# Patient Record
Sex: Female | Born: 1975 | Hispanic: No | Marital: Married | Smoking: Never smoker
Health system: Southern US, Community
[De-identification: ages and names within clinical notes are randomized; demographics above are authoritative.]

## PROBLEM LIST (undated history)

## (undated) DIAGNOSIS — F909 Attention-deficit hyperactivity disorder, unspecified type: Secondary | ICD-10-CM

## (undated) DIAGNOSIS — F329 Major depressive disorder, single episode, unspecified: Secondary | ICD-10-CM

## (undated) DIAGNOSIS — K219 Gastro-esophageal reflux disease without esophagitis: Secondary | ICD-10-CM

## (undated) DIAGNOSIS — F32A Depression, unspecified: Secondary | ICD-10-CM

## (undated) DIAGNOSIS — E039 Hypothyroidism, unspecified: Secondary | ICD-10-CM

## (undated) DIAGNOSIS — F419 Anxiety disorder, unspecified: Secondary | ICD-10-CM

## (undated) HISTORY — DX: Gastro-esophageal reflux disease without esophagitis: K21.9

## (undated) HISTORY — DX: Anxiety disorder, unspecified: F41.9

## (undated) HISTORY — DX: Hypothyroidism, unspecified: E03.9

## (undated) HISTORY — PX: CHOLECYSTECTOMY: SHX55

## (undated) HISTORY — PX: COLONOSCOPY: SHX174

## (undated) HISTORY — DX: Major depressive disorder, single episode, unspecified: F32.9

## (undated) HISTORY — PX: TONSILLECTOMY: SUR1361

## (undated) HISTORY — DX: Depression, unspecified: F32.A

## (undated) HISTORY — PX: MASTECTOMY: SHX3

## (undated) HISTORY — DX: Attention-deficit hyperactivity disorder, unspecified type: F90.9

## (undated) HISTORY — PX: BREAST ENHANCEMENT SURGERY: SHX7

---

## 2009-06-16 ENCOUNTER — Encounter: Payer: Self-pay | Admitting: Internal Medicine

## 2009-06-20 ENCOUNTER — Encounter (INDEPENDENT_AMBULATORY_CARE_PROVIDER_SITE_OTHER): Payer: Self-pay | Admitting: *Deleted

## 2009-06-20 ENCOUNTER — Telehealth: Payer: Self-pay | Admitting: Internal Medicine

## 2009-07-04 ENCOUNTER — Encounter (INDEPENDENT_AMBULATORY_CARE_PROVIDER_SITE_OTHER): Payer: Self-pay | Admitting: *Deleted

## 2009-07-04 ENCOUNTER — Ambulatory Visit: Payer: Self-pay | Admitting: Internal Medicine

## 2009-07-04 DIAGNOSIS — K921 Melena: Secondary | ICD-10-CM | POA: Insufficient documentation

## 2009-07-04 DIAGNOSIS — IMO0001 Reserved for inherently not codable concepts without codable children: Secondary | ICD-10-CM | POA: Insufficient documentation

## 2009-07-04 DIAGNOSIS — R198 Other specified symptoms and signs involving the digestive system and abdomen: Secondary | ICD-10-CM | POA: Insufficient documentation

## 2009-07-04 DIAGNOSIS — R5383 Other fatigue: Secondary | ICD-10-CM

## 2009-07-04 DIAGNOSIS — D649 Anemia, unspecified: Secondary | ICD-10-CM

## 2009-07-04 DIAGNOSIS — R5381 Other malaise: Secondary | ICD-10-CM

## 2009-07-04 DIAGNOSIS — R634 Abnormal weight loss: Secondary | ICD-10-CM | POA: Insufficient documentation

## 2009-07-04 LAB — CONVERTED CEMR LAB
ALT: 18 units/L (ref 0–35)
Albumin: 4.4 g/dL (ref 3.5–5.2)
Alkaline Phosphatase: 45 units/L (ref 39–117)
Basophils Absolute: 0 10*3/uL (ref 0.0–0.1)
HCT: 35.3 % — ABNORMAL LOW (ref 36.0–46.0)
Lymphs Abs: 1.6 10*3/uL (ref 0.7–4.0)
Monocytes Absolute: 0.3 10*3/uL (ref 0.1–1.0)
Monocytes Relative: 5.4 % (ref 3.0–12.0)
Neutrophils Relative %: 61.6 % (ref 43.0–77.0)
Platelets: 237 10*3/uL (ref 150.0–400.0)
Potassium: 3.8 meq/L (ref 3.5–5.1)
RDW: 14.6 % (ref 11.5–14.6)
Sodium: 143 meq/L (ref 135–145)
Total Bilirubin: 0.5 mg/dL (ref 0.3–1.2)
Total Protein: 6.6 g/dL (ref 6.0–8.3)
WBC: 5.1 10*3/uL (ref 4.5–10.5)

## 2009-07-21 ENCOUNTER — Ambulatory Visit: Payer: Self-pay | Admitting: Internal Medicine

## 2009-07-21 HISTORY — PX: COLONOSCOPY: SHX174

## 2010-04-04 NOTE — Letter (Signed)
Summary: New Patient letter  [facility]  [address] [place], [place] [address]   Phone: [phone]  Fax: [phone]       06/20/2009 MRN: [id number]  Pamela Mcdowell [address] [place], [place]  [address]  Dear Pamela Mcdowell,  Welcome to the Gastroenterology Division at [facility].    You are scheduled to see Dr.  Leone Payor on Jul 25, 2009 at 2pm on the 3rd floor at [facility], [address]  We ask that you try to arrive at our office 15 minutes prior to your appointment time to allow for check-in.  We would like you to complete the enclosed self-administered evaluation form prior to your visit and bring it with you on the day of your appointment.  We will review it with you.  Also, please bring a complete list of all your medications or, if you prefer, bring the medication bottles and we will list them.  Please bring your insurance card so that we may make a copy of it.  If your insurance requires a referral to see a specialist, please bring your referral form from your primary care physician.  Co-payments are due at the time of your visit and may be paid by cash, check or credit card.     Your office visit will consist of a consult with your physician (includes a physical exam), any laboratory testing he/she may order, scheduling of any necessary diagnostic testing (e.g. x-ray, ultrasound, CT-scan), and scheduling of a procedure (e.g. Endoscopy, Colonoscopy) if required.  Please allow enough time on your schedule to allow for any/all of these possibilities.    If you cannot keep your appointment, please call [phone] to cancel or reschedule prior to your appointment date.  This allows [place] the opportunity to schedule an appointment for another patient in need of care.  If you do not cancel or reschedule by 5 p.m. the business day prior to your appointment date, you will be charged a $50.00 late cancellation/no-show fee.    Thank you for choosing  [facility] for your medical needs.  We appreciate the opportunity to care for you.  Please visit [place] at our website  to learn more about our practice.                     Sincerely,                                                             The Gastroenterology Division

## 2010-04-04 NOTE — Assessment & Plan Note (Signed)
Summary: LOWER BACK PAIN, LOOSE STOOLS,RECTAL BLEEDING/YF   History of Present Illness Visit Type: Initial Consult Primary GI MD: Stan Head MD [facility] Primary Provider: Alinda Deem, MD Requesting Provider: Alinda Deem, MD Chief Complaint: low back pain, loose stools, and rectal bleeding History of Present Illness:   35 yo woman without significant past medical history. She had acute abdominal pain in !04/2008 , US showd gallstones and she had a cholecystectomy and felt ok immediately after except for some transient diarrhea. She ran a half-marathon in February ok. Developed fatigue and muscle aches around daylihjy savings time. Then a few weeks later she saw blood in the toilet after defecation. It happened x 4 days and then stopped. Saw PCP and stool negative for occult blood. she has continues with fatigue, extremities tired and achy, and 5+ bowel movements a day. they are formed, sometimes slender or thin. Do not disturb sleep. She is anemic. Iron was started in Spring. These are new issues, no chronic problems. No diet changes or medication changes. Some weight loss since cholecystecomy, 10#. Periods without changes, uses pads 5 days, 3 days with significant blood.    GI Review of Systems    Reports abdominal pain, bloating, and  weight loss.   Weight loss of 10 lbs pounds   Denies acid reflux, belching, chest pain, dysphagia with liquids, dysphagia with solids, heartburn, loss of appetite, nausea, vomiting, vomiting blood, and  weight gain.      Reports change in bowel habits, diarrhea, and  rectal bleeding.     Denies anal fissure, black tarry stools, constipation, diverticulosis, fecal incontinence, heme positive stool, hemorrhoids, irritable bowel syndrome, jaundice, light color stool, liver problems, and  rectal pain. Preventive Screening-Counseling & Management  Alcohol-Tobacco     Smoking Status: never      Drug Use:  no.      Current Medications (verified): 1)   Concerta 36 Mg Cr-Tabs (Methylphenidate Hcl) .Marland Kitchen.. 1 By Mouth Once Daily 2)  Wellbutrin Xl 150 Mg Xr24h-Tab (Bupropion Hcl) .Marland Kitchen.. 1 By Mouth Two Times A Day 3)  Ferrous Sulfate 324 Mg Tbec (Ferrous Sulfate) .Marland Kitchen.. 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 Skin cancer-father PGF Lymphoma-PGF No FH of Colon Cancer:  Social History: Reviewed history and no changes required. 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  Alcohol Use - yes 5 per week Illicit Drug Use - no long-distance runner and frequent gym workoutsSmoking Status:  never Drug Use:  no  Review of Systems       The patient complains of anemia, back pain, depression-new, fatigue, headaches-new, and muscle pains/cramps.         All other ROS negative except as per HPI.   Vital Signs:  Patient profile:   35 year old female Height:      66134 inches Weight:      6 pounds BMI:     0.00 Pulse rate:   60 / minute Pulse rhythm:   regular BP sitting:   118 / 74  (left arm)  Vitals Entered By: Milford Cage NCMA (Jul 04, 2009 2:26 PM)  Physical Exam  General:  Thin, NAD Eyes:  PERRLA, no icterus. Mouth:  No deformity or lesions, dentition normal. Neck:  Supple; no masses or thyromegaly. Lungs:  Clear throughout to auscultation. Heart:  Regular rate and rhythm; no murmurs, rubs,  or bruits. Abdomen:  Soft, nontender and nondistended. No masses, hepatosplenomegaly or hernias noted. Normal bowel sounds. Rectal:  deferred until time of colonoscopy.   Extremities:  No clubbing, cyanosis, edema or deformities noted. Neurologic:  Alert and  oriented x4;   Cervical Nodes:  No significant cervical or supraclavicular adenopathy.  Psych:  Alert and cooperative. Normal mood and  affect.   Impression & Recommendations:  Problem # 1:  HEMATOCHEZIA (ICD-578.1) Assessment New  Small volume associated with increased stool frequency. Could be anorectal but has lost some weight. ? IBD or neoplasia. Celiac disease seems possible alsso given anemia and increased stools. Orders: T-Tissue Transglutamase Ab IgA [id number]) Colonoscopy (Colon) - Risks, benefits,and indications of endoscopic procedure(s) were reviewed with the patient and all questions answered.  TLB-CBC Platelet - w/Differential (85025-CBCD) TLB-IgA (Immunoglobulin A) (82784-IGA)  Problem # 2:  CHANGE IN BOWELS (UJW-119.14) Assessment: New Increased stool frequency. ? post-chole effect, celiac disease, IBD, neoplasia of bowel Await colonoscopy and labs if TTG Ab + then EGd and bx also  Problem # 3:  ANEMIA-UNSPECIFIED (ICD-285.9) Assessment: New does not seem to be related to menses (unless chronic blood loss from these) and with GI sxs and signs needs investigation.  Problem # 4:  FATIGUE (ICD-780.79) Assessment: New  Problem # 5:  MUSCLE PAIN (ICD-729.1) Assessment: New could be a component of celiac disease, ? other Orders: TLB-CMP (Comprehensive Metabolic Pnl) (80053-COMP) TLB-CK Total Only(Creatine Kinase/CPK) (82550-CK)  Problem # 6:  WEIGHT LOSS (ICD-783.21) Assessment: New etiology not clear, IBD, celiac, neoplasia are in differential dx  Patient Instructions: 1)  Please go to the basement to have your lab tests drawn today.  We will call you with further follow up instructions once these labs are available.  2)  Please pick up your medications at your pharmacy. 3)  We will see you at your colonoscopy on 07/21/09.  You will hear from [place] prior to this appointment if we need to add an upper endoscopy. 4)   Endoscopy Center Patient Information Guide given to patient.  5)  Colonoscopy and Flexible Sigmoidoscopy brochure given.  6)  Copy sent to : Alinda Deem, MD 7)  The  medication list was reviewed and reconciled.  All changed / newly prescribed medications were explained.  A complete medication list was provided to the patient / caregiver. Prescriptions: MOVIPREP 100 GM  SOLR (PEG-KCL-NACL-NASULF-NA ASC-C) As per prep instructions.  #1 x 0   Entered by:   Francee Piccolo CMA (AAMA)   Authorized by:   Iva Boop MD, [facility]   Signed by:   Francee Piccolo CMA (AAMA) on 07/04/2009   Method used:   Electronically to        [facility] [phone]* (retail)       [address]       [place], [place]  [address]       Ph: [phone] or [phone]       Fax: [phone]   RxID:   [id number]  Patient: [name] Note: All result statuses are Final unless otherwise noted.  Tests: (1) CBC Platelet w/Diff (CBCD)   White Cell Count          5.1 K/uL                    4.5-10.5   Red Cell Count  3.88 Mil/uL                 3.87-5.11   Hemoglobin                12.0 g/dL                   16.1-09.6   Hematocrit           [L]  35.3 %                      36.0-46.0   MCV                       90.9 fl                     78.0-100.0   MCHC                      34.1 g/dL                   04.5-40.9   RDW                       14.6 %                      11.5-14.6   Platelet Count            237.0 K/uL                  150.0-400.0   Neutrophil %              61.6 %                      43.0-77.0   Lymphocyte %              31.4 %                      12.0-46.0   Monocyte %                5.4 %                       3.0-12.0   Eosinophils%              0.9 %                       0.0-5.0   Basophils %               0.7 %                       0.0-3.0   Neutrophill Absolute      3.2 K/uL                    1.4-7.7   Lymphocyte Absolute       1.6 K/uL                    0.7-4.0   Monocyte Absolute         0.3 K/uL                    0.1-1.0  Eosinophils, Absolute  0.0 K/uL                     0.0-0.7   Basophils Absolute        0.0 K/uL                    0.0-0.1  Tests: (2) CMP (COMP)   Sodium                    143 mEq/L                   135-145   Potassium                 3.8 mEq/L                   3.5-5.1   Chloride                  105 mEq/L                   96-112   Carbon Dioxide       [H]  33 mEq/L                    19-32   Glucose                   90 mg/dL                    16-10   BUN                       12 mg/dL                    9-60   Creatinine                0.7 mg/dL                   4.5-4.0   Total Bilirubin           0.5 mg/dL                   9.8-1.1   Alkaline Phosphatase      45 U/L                      39-117   AST                       30 U/L                      0-37   ALT                       18 U/L                      0-35   Total Protein             6.6 g/dL                    9.1-4.7   Albumin                   4.4 g/dL                    8.2-9.5   Calcium  9.6 mg/dL                   1.6-10.9   GFR                       103.30 mL/min               >60  Tests: (3) Creatine Kinase (CK)   Creatine Kinase           163 U/L                     7-177  Tests: (4) Immunoglobulin A (IGA)   Immunoglobulin A          109 mg/dL                   60-454        Note: An exclamation mark (!) indicates a result that was not dispersed into the flowsheet. Document Creation Date: 07/04/2009 5:28 PM

## 2010-04-04 NOTE — Letter (Signed)
Summary: [facility]  [facility]   Imported By: Sherian Rein 07/13/2009 11:19:51  _____________________________________________________________________  External Attachment:    Type:   Image     Comment:   External Document

## 2010-04-04 NOTE — Progress Notes (Signed)
Summary: triage  Phone Note Call from Patient Call back at Home Phone [phone]   Caller: Patient Call For: Dr. Leone Payor Reason for Call: Talk to Nurse Summary of Call: would like sooner appt then currently sch'ed for...  May 23rd... offered May 13th with Dr. Jarold Motto, but pt wants sooner Initial call taken by: Vallarie Mare,  June 20, 2009 11:46 AM  Follow-up for Phone Call        Appt. moved up to 07/04/09 at 2:30 pm with Dr.Edwinna Rochette. Message left on pt's. machine. Follow-up by: Teryl Lucy RN,  June 21, 2009 9:24 AM

## 2010-04-04 NOTE — Letter (Signed)
Summary: [facility] Instructions  [facility]  [address] [place], [place] [address]   Phone: [phone]  Fax: [phone]       Pamela Mcdowell    Jun 05, 1975    MRN: [id number]      Procedure Day Dorna Bloom: [name], 07/21/09     Arrival Time: 12:30 PM      Procedure Time: 1:30 PM    Location of Procedure:                    _X_  [facility] (4th Floor)                      PREPARATION FOR COLONOSCOPY WITH MOVIPREP   Starting 5 days prior to your procedure 07/16/09 do not eat nuts, seeds, popcorn, corn, beans, peas,  salads, or any raw vegetables.  Do not take any fiber supplements (e.g. Metamucil, Citrucel, and Benefiber).  THE DAY BEFORE YOUR PROCEDURE         WEDNESDAY, 07/20/09  1.  Drink clear liquids the entire day-NO SOLID FOOD  2.  Do not drink anything colored red or purple.  Avoid juices with pulp.  No orange juice.  3.  Drink at least 64 oz. (8 glasses) of fluid/clear liquids during the day to prevent dehydration and help the prep work efficiently.  CLEAR LIQUIDS INCLUDE: Water Jello Ice Popsicles Tea (sugar ok, no milk/cream) Powdered fruit flavored drinks Coffee (sugar ok, no milk/cream) Gatorade Juice: apple, white grape, white cranberry  Lemonade Clear bullion, consomm, broth Carbonated beverages (any kind) Strained chicken noodle soup Hard Candy                             4.  In the morning, mix first dose of MoviPrep solution:    Empty 1 Pouch A and 1 Pouch B into the disposable container    Add lukewarm drinking water to the top line of the container. Mix to dissolve    Refrigerate (mixed solution should be used within 24 hrs)  5.  Begin drinking the prep at 5:00 p.m. The MoviPrep container is divided by 4 marks.   Every 15 minutes drink the solution down to the next mark (approximately 8 oz) until the full liter is complete.   6.  Follow completed prep with 16 oz of clear liquid of your choice (Nothing red or  purple).  Continue to drink clear liquids until bedtime.    7.  Before going to bed, mix second dose of MoviPrep solution:    Empty 1 Pouch A and 1 Pouch B into the disposable container    Add lukewarm drinking water to the top line of the container. Mix to dissolve    Refrigerate  THE DAY OF YOUR PROCEDURE      THURSDAY, 07/21/09  Beginning at 8:30a.m. (5 hours before procedure):         1. Every 15 minutes, drink the solution down to the next mark (approx 8 oz) until the full liter is complete.  2. Follow completed prep with 16 oz. of clear liquid of your choice.    3. You may drink clear liquids until 11:30 AM (2 HOURS BEFORE PROCEDURE).  MEDICATION INSTRUCTIONS  Unless otherwise instructed, you should take regular prescription medications with a small sip of water   as early as possible the morning of your procedure.       OTHER INSTRUCTIONS  You will need a responsible adult at least 33 years of age to accompany you and drive you home.   This person must remain in the waiting room during your procedure.  Wear loose fitting clothing that is easily removed.  Leave jewelry and other valuables at home.  However, you may wish to bring a book to read or  an iPod/MP3 player to listen to music as you wait for your procedure to start.  Remove all body piercing jewelry and leave at home.  Total time from sign-in until discharge is approximately 2-3 hours.  You should go home directly after your procedure and rest.  You can resume normal activities the  day after your procedure.  The day of your procedure you should not:   Drive   Make legal decisions   Operate machinery   Drink alcohol   Return to work  You will receive specific instructions about eating, activities and medications before you leave.  The above instructions have been reviewed and explained to me by   [facility], CMA  I fully understand and can verbalize these instructions  _____________________________ Date _________

## 2010-04-04 NOTE — Procedures (Signed)
Summary: Colonoscopy  Patient: [name] Note: All result statuses are Final unless otherwise noted.  Tests: (1) Colonoscopy (COL)   COL Colonoscopy           DONE (C)      [facility]oscopy Center     [address]     [place], [place]  [address]           COLONOSCOPY PROCEDURE REPORT           PATIENT:  Pamela Mcdowell, Pamela Mcdowell  MR#:  [id number]     BIRTHDATE:  07/08/1975, 33 yrs. old  GENDER:  female     ENDOSCOPIST:  Iva Boop, MD, [facility]     REF. BY:  Alinda Deem, M.D.     PROCEDURE DATE:  07/21/2009     PROCEDURE:  Colonoscopy 19[address]     ASA CLASS:  Class I     INDICATIONS:  hematochezia     MEDICATIONS:   Fentanyl 75 mcg IV, Versed 6 mg IVCORRECTION: 5 mg     Versed           DESCRIPTION OF PROCEDURE:   After the risks benefits and     alternatives of the procedure were thoroughly explained, informed     consent was obtained.  Digital rectal exam was performed and     revealed no abnormalities.   The  endoscope was introduced through     the anus and advanced to the terminal ileum which was intubated     for a short distance, without limitations.  The quality of the     prep was excellent, using MoviPrep.  The instrument was then     slowly withdrawn as the colon was fully examined. Insertion: 4:20     minutes withdrawal: 5:15 minutes     <<PROCEDUREIMAGES>>           FINDINGS:  The terminal ileum appeared normal.  A normal appearing     cecum, ileocecal valve, and appendiceal orifice were identified.     The ascending, hepatic flexure, transverse, splenic flexure,     descending, sigmoid colon, and rectum appeared unremarkable.  This     was otherwise a normal examination of the colon.   Retroflexed     views in the rectum revealed internal hemorrhoids.  They were     small.  The scope was then withdrawn from the patient and the     procedure completed.           COMPLICATIONS:  None     ENDOSCOPIC IMPRESSION:     1) Normal colon     2) Normal terminal ileum   3) Internal hemorrhoids     4) Otherwise normal examination, excellent prep           RECOMMENDATIONS:     Start hyoscyamine 0.375 mg twice a day. Prescription sent to     pharmacy.           ADDENDUM: She was instructed to call for a follow-up appointment     to be seen in 6- 8 weeks.           Iva Boop, MD, [facility]           CC:  Alinda Deem, MD     The Patient           n.     REVISED:  08/03/2009 09:55 AM     eSIGNED:   Iva Boop at 08/03/2009 09:55 AM  [place], [place], [phone]  Note: An exclamation mark (!) indicates a result that was not dispersed into the flowsheet. Document Creation Date: 08/03/2009 9:56 AM _______________________________________________________________________  (1) Order result status: Final Collection or observation date-time: 07/21/2009 13:53 Requested date-time:  Receipt date-time:  Reported date-time:  Referring Physician:   Ordering Physician: Stan Head [phone]) Specimen Source:  Source: Launa Grill Order Number: [id number] Lab site:

## 2010-04-04 NOTE — Miscellaneous (Signed)
Summary: hyoscyamine rx after colonoscopy  Clinical Lists Changes  Medications: Removed medication of MOVIPREP 100 GM  SOLR (PEG-KCL-NACL-NASULF-NA ASC-C) As per prep instructions. Added new medication of HYOSCYAMINE SULFATE CR 0.375 MG XR12H-TAB (HYOSCYAMINE SULFATE) 1 by mouth bid - Signed Rx of HYOSCYAMINE SULFATE CR 0.375 MG XR12H-TAB (HYOSCYAMINE SULFATE) 1 by mouth bid;  #60 x 1;  Signed;  Entered by: Iva Boop MD, [facility];  Authorized by: Iva Boop MD, [facility];  Method used: Electronically to [facility] [phone]*, [address] [place], [place], [place]  [address], Ph: [phone] or [phone], Fax: [phone]    Prescriptions: HYOSCYAMINE SULFATE CR 0.375 MG XR12H-TAB (HYOSCYAMINE SULFATE) 1 by mouth bid  #60 x 1   Entered and Authorized by:   Iva Boop MD, [facility]   Signed by:   Iva Boop MD, FACG on 07/21/2009   Method used:   Electronically to        [facility]. [phone]* (retail)       [address]       [place], [place]  [address]       Ph: [phone] or [phone]       Fax: [phone]   RxID:   [id number]

## 2014-07-07 ENCOUNTER — Encounter: Payer: Self-pay | Admitting: Internal Medicine

## 2018-02-14 ENCOUNTER — Encounter: Payer: Self-pay | Admitting: Physician Assistant

## 2018-02-21 ENCOUNTER — Other Ambulatory Visit: Payer: Self-pay | Admitting: *Deleted

## 2018-02-21 ENCOUNTER — Encounter: Payer: Self-pay | Admitting: Physician Assistant

## 2018-02-21 ENCOUNTER — Ambulatory Visit: Payer: 59 | Admitting: Physician Assistant

## 2018-02-21 VITALS — BP 140/92 | HR 93 | Ht 66.0 in | Wt 146.0 lb

## 2018-02-21 DIAGNOSIS — R110 Nausea: Secondary | ICD-10-CM

## 2018-02-21 DIAGNOSIS — R1319 Other dysphagia: Secondary | ICD-10-CM

## 2018-02-21 DIAGNOSIS — R1310 Dysphagia, unspecified: Secondary | ICD-10-CM | POA: Diagnosis not present

## 2018-02-21 DIAGNOSIS — K219 Gastro-esophageal reflux disease without esophagitis: Secondary | ICD-10-CM

## 2018-02-21 MED ORDER — ESOMEPRAZOLE MAGNESIUM 40 MG PO CPDR: DELAYED_RELEASE_CAPSULE | ORAL | 3 refills | Status: DC

## 2018-02-21 MED ORDER — AMBULATORY NON FORMULARY MEDICATION: 0 refills | Status: DC

## 2018-02-21 NOTE — Progress Notes (Signed)
Chief Complaint: GERD, dysphagia, nausea  HPI:    [name] is a 42 year old female with a past medical history as listed below, who presents to clinic today for complaint of nausea, GERD and dysphagia.    Today, the patient describes that she has had reflux symptoms for at least a year for which she was using over-the-counter Zantac or over-the-counter Prilosec which would only help slightly.  Describes severe instances where she was having to "stretch out her stomach" or lie on a cold floor in order to make it feel better.  Recently this has become more frequent and an almost daily occurrence.  Also describes having to wake up in the middle of the night and "clear my throat".  Along with this has been experiencing some feeling of foods getting stuck when she is eating which has also increased.  Some nausea, especially worse with exercise.    Patient also notes various other health complaints including hair thinning and falling out, occasional heart racing and foot and calf cramps over the past few months.  She is having her PCP look into this, just had thyroid panel drawn.  Was also recently told she had vitamin D deficiency and is on supplementation.    Denies fever, chills, weight loss, anorexia, vomiting, change in bowel habits or blood in her stool.  Past Medical History:  Diagnosis Date  . GERD (gastroesophageal reflux disease)     Past Surgical History:  Procedure Laterality Date  . CHOLECYSTECTOMY    . COLONOSCOPY    . TONSILLECTOMY      Current Outpatient Medications  Medication Sig Dispense Refill  . ergocalciferol (VITAMIN D2) 1.25 MG (50000 UT) capsule Take 50,000 Units by mouth once a week.    . pantoprazole (PROTONIX) 40 MG tablet Take 40 mg by mouth daily.     No current facility-administered medications for this visit.     Allergies as of 02/21/2018  . (Not on File)    History reviewed. No pertinent family history.  Social History   Socioeconomic History    . Marital status: Married    Spouse name: Not on file  . Number of children: 1  . Years of education: Not on file  . Highest education level: Not on file  Occupational History  . Occupation: Forensic scientistController   Social Needs  . Financial resource strain: Not on file  . Food insecurity:    Worry: Not on file    Inability: Not on file  . Transportation needs:    Medical: Not on file    Non-medical: Not on file  Tobacco Use  . Smoking status: Never Smoker  . Smokeless tobacco: Never Used  Substance and Sexual Activity  . Alcohol use: Yes    Alcohol/week: 7.0 standard drinks    Types: 7 Glasses of wine per week  . Drug use: Never  . Sexual activity: Not on file  Lifestyle  . Physical activity:    Days per week: Not on file    Minutes per session: Not on file  . Stress: Not on file  Relationships  . Social connections:    Talks on phone: Not on file    Gets together: Not on file    Attends religious service: Not on file    Active member of club or organization: Not on file    Attends meetings of clubs or organizations: Not on file    Relationship status: Not on file  . Intimate partner violence:  Fear of current or ex partner: Not on file    Emotionally abused: Not on file    Physically abused: Not on file    Forced sexual activity: Not on file  Other Topics Concern  . Not on file  Social History Narrative  . Not on file    Review of Systems:    Constitutional: No weight loss, fever or chills Skin: No rash  Cardiovascular: No chest pain Respiratory: No SOB  Gastrointestinal: See HPI and otherwise negative Genitourinary: No dysuria  Neurological: No headache, dizziness or syncope Musculoskeletal: No new muscle or joint pain Hematologic: No bleeding  Psychiatric: No history of depression or anxiety   Physical Exam:  Vital signs: BP (!) 140/92   Pulse 93   Ht 5\' 6"  (1.676 m)   Wt 146 lb (66.2 kg)   SpO2 100%   BMI 23.57 kg/m   Constitutional:   Pleasant  Caucasian female appears to be in NAD, Well developed, Well nourished, alert and cooperative Head:  Normocephalic and atraumatic. Eyes:   PEERL, EOMI. No icterus. Conjunctiva pink. Ears:  Normal auditory acuity. Neck:  Supple Throat: Oral cavity and pharynx without inflammation, swelling or lesion.  Respiratory: Respirations even and unlabored. Lungs clear to auscultation bilaterally.   No wheezes, crackles, or rhonchi.  Cardiovascular: Normal S1, S2. No MRG. Regular rate and rhythm. No peripheral edema, cyanosis or pallor.  Gastrointestinal:  Soft, nondistended, nontender. No rebound or guarding. Normal bowel sounds. No appreciable masses or hepatomegaly. Rectal:  Not performed.  Msk:  Symmetrical without gross deformities. Without edema, no deformity or joint abnormality.  Neurologic:  Alert and  oriented x4;  grossly normal neurologically.  Skin:   Dry and intact without significant lesions or rashes. Psychiatric: Demonstrates good judgement and reason without abnormal affect or behaviors.  Requesting recent labs.  Assessment: 1.  GERD: Over the past year, worse over the past few months, no help from pantoprazole 40 mg; consider H. pylori versus PUD versus GERD 2.  Dysphagia: Occasional food gets stuck in the patient's throat, worse over the past few months; consider esophageal stricture versus ring versus other 3.  Nausea  Plan: 1.  Scheduled patient for an EGD in the [facility] with Dr. Lavon PaganiniNandigam as she had availability.  Did discuss risks, benefits, limitations and alternatives and the patient agrees to proceed.  Patient will continue to follow with Dr. Lavon PaganiniNandigam as her primary GI physician after time of procedure. 2.  Stopped Pantoprazole.  Started Esomeprazole 40 mg daily, 30-60 minutes before eating breakfast #30 with 3 refills. 3.  Provided the patient with GI cocktail 5-10 mL's every 6 hours as needed for severe pain 4.  Patient does question whether or not some of her health concerns  over the past week or so related to addition of Pantoprazole. Discussed this. 5.  Patient to follow in clinic per recommendations from Dr. Lavon PaganiniNandigam after time of procedure.  Did discuss briefly with the patient that if her thyroid testing is normal would recommend she see someone in regards to autoimmune disorders given her various health complaints today.  Hyacinth MeekerJennifer Ferry Matthis, PA-C [facility] 02/21/2018, 10:26 AM

## 2018-02-21 NOTE — Patient Instructions (Addendum)
We have provided you with a Reflux handout. We will call in a prescription for GI Cocktail. It will be at a compounding pharmacy. They will call you when it is ready.  Stop the Pantoprazole sodium 40 mg. .  We will call in the Esomeprazole 40 mg daily. Take 1 cap 30-60 min before breakfast.   You have been scheduled for an endoscopy. Please follow written instructions given to you at your visit today. If you use inhalers (even only as needed), please bring them with you on the day of your procedure. Normal BMI (Body Mass Index- based on height and weight) is between 19 and 25. Your BMI today is Body mass index is 23.57 kg/m. Marland Kitchen. Please consider follow up  regarding your BMI with your Primary Care Provider.

## 2018-02-21 NOTE — Progress Notes (Signed)
Non

## 2018-03-10 ENCOUNTER — Ambulatory Visit (AMBULATORY_SURGERY_CENTER): Payer: 59 | Admitting: Gastroenterology

## 2018-03-10 ENCOUNTER — Encounter: Payer: Self-pay | Admitting: Gastroenterology

## 2018-03-10 ENCOUNTER — Other Ambulatory Visit: Payer: Self-pay

## 2018-03-10 VITALS — BP 114/60 | HR 65 | Temp 98.6°F | Resp 25 | Ht 66.0 in | Wt 146.0 lb

## 2018-03-10 DIAGNOSIS — K2970 Gastritis, unspecified, without bleeding: Secondary | ICD-10-CM | POA: Diagnosis not present

## 2018-03-10 DIAGNOSIS — K317 Polyp of stomach and duodenum: Secondary | ICD-10-CM | POA: Diagnosis not present

## 2018-03-10 DIAGNOSIS — K219 Gastro-esophageal reflux disease without esophagitis: Secondary | ICD-10-CM

## 2018-03-10 MED ORDER — SODIUM CHLORIDE 0.9 % IV SOLN: 500.0000 mL | Freq: Once | INTRAVENOUS | Status: DC

## 2018-03-10 NOTE — Progress Notes (Signed)
Report to PACU, RN, vss, BBS= Clear.  

## 2018-03-10 NOTE — Progress Notes (Signed)
Called to room to assist during endoscopic procedure.  Patient ID and intended procedure confirmed with present staff. Received instructions for my participation in the procedure from the performing physician.  

## 2018-03-10 NOTE — Op Note (Signed)
[facility] Patient Name: Pamela Mcdowell Procedure Date: 03/10/2018 9:59 AM MRN: [id number] Endoscopist: Napoleon FormKavitha V. Alajah Witman , MD Age: 43 Referring MD:  Date of Birth: 10/02/1975 Gender: Female Account #: [id number] Procedure:                Upper GI endoscopy Indications:              Dysphagia, Esophageal reflux symptoms that persist                            despite appropriate therapy Medicines:                Monitored Anesthesia Care Procedure:                Pre-Anesthesia Assessment:                           - Prior to the procedure, a History and Physical                            was performed, and patient medications and                            allergies were reviewed. The patient's tolerance of                            previous anesthesia was also reviewed. The risks                            and benefits of the procedure and the sedation                            options and risks were discussed with the patient.                            All questions were answered, and informed consent                            was obtained. Prior Anticoagulants: The patient has                            taken no previous anticoagulant or antiplatelet                            agents. ASA Grade Assessment: II - A patient with                            mild systemic disease. After reviewing the risks                            and benefits, the patient was deemed in                            satisfactory condition to undergo the procedure.  After obtaining informed consent, the endoscope was                            passed under direct vision. Throughout the                            procedure, the patient's blood pressure, pulse, and                            oxygen saturations were monitored continuously. The                            Endoscope was introduced through the mouth, and                            advanced to the second  part of duodenum. The upper                            GI endoscopy was accomplished without difficulty.                            The patient tolerated the procedure well. Scope In: Scope Out: Findings:                 The Z-line was variable and was found 35 cm from                            the incisors.                           No endoscopic abnormality was evident in the                            esophagus to explain the patient's complaint of                            dysphagia. Biopsies were obtained from the proximal                            and distal esophagus with cold forceps for                            histology of suspected eosinophilic esophagitis.                           Patchy mild inflammation characterized by                            congestion (edema) and erythema was found in the                            entire examined stomach. Biopsies were taken with a                            cold  forceps for Helicobacter pylori testing.                           The examined duodenum was normal. Complications:            No immediate complications. Estimated Blood Loss:     Estimated blood loss was minimal. Impression:               - Z-line variable, 35 cm from the incisors.                           - No endoscopic esophageal abnormality to explain                            patient's dysphagia. Biopsied.                           - Gastritis. Biopsied.                           - Normal examined duodenum. Recommendation:           - Patient has a contact number available for                            emergencies. The signs and symptoms of potential                            delayed complications were discussed with the                            patient. Return to normal activities tomorrow.                            Written discharge instructions were provided to the                            patient.                           - Resume previous diet.                            - Continue present medications.                           - Await pathology results.                           - Return to GI office at the next available                            appointment. Napoleon Form, MD 03/10/2018 10:17:07 AM This report has been signed electronically.

## 2018-03-10 NOTE — Patient Instructions (Signed)
YOU HAD AN ENDOSCOPIC PROCEDURE TODAY AT THE [facility]:   Refer to the procedure report that was given to you for any specific questions about what was found during the examination.  If the procedure report does not answer your questions, please call your gastroenterologist to clarify.  If you requested that your care partner not be given the details of your procedure findings, then the procedure report has been included in a sealed envelope for you to review at your convenience later.  YOU SHOULD EXPECT: Some feelings of bloating in the abdomen. Passage of more gas than usual.  Walking can help get rid of the air that was put into your GI tract during the procedure and reduce the bloating. If you had a lower endoscopy (such as a colonoscopy or flexible sigmoidoscopy) you may notice spotting of blood in your stool or on the toilet paper. If you underwent a bowel prep for your procedure, you may not have a normal bowel movement for a few days.  Please Note:  You might notice some irritation and congestion in your nose or some drainage.  This is from the oxygen used during your procedure.  There is no need for concern and it should clear up in a day or so.  SYMPTOMS TO REPORT IMMEDIATELY:   Following upper endoscopy (EGD)  Vomiting of blood or coffee ground material  New chest pain or pain under the shoulder blades  Painful or persistently difficult swallowing  New shortness of breath  Fever of 100F or higher  Black, tarry-looking stools  For urgent or emergent issues, a gastroenterologist can be reached at any hour by calling [phone].   DIET:  We do recommend a small meal at first, but then you may proceed to your regular diet.  Drink plenty of fluids but you should avoid alcoholic beverages for 24 hours.  ACTIVITY:  You should plan to take it easy for the rest of today and you should NOT DRIVE or use heavy machinery until tomorrow (because of the sedation medicines used  during the test).    FOLLOW UP: Our staff will call the number listed on your records the next business day following your procedure to check on you and address any questions or concerns that you may have regarding the information given to you following your procedure. If we do not reach you, we will leave a message.  However, if you are feeling well and you are not experiencing any problems, there is no need to return our call.  We will assume that you have returned to your regular daily activities without incident.  If any biopsies were taken you will be contacted by phone or by letter within the next 1-3 weeks.  Please call us at [phone] if you have not heard about the biopsies in 3 weeks.    SIGNATURES/CONFIDENTIALITY: You and/or your care partner have signed paperwork which will be entered into your electronic medical record.  These signatures attest to the fact that that the information above on your After Visit Summary has been reviewed and is understood.  Full responsibility of the confidentiality of this discharge information lies with you and/or your care-partner. 

## 2018-03-11 ENCOUNTER — Telehealth: Payer: Self-pay | Admitting: *Deleted

## 2018-03-11 NOTE — Telephone Encounter (Signed)
  Follow up Call-  Call back number 03/10/2018  Post procedure Call Back phone  # [phone]  Permission to leave phone message Yes  Some recent data might be hidden     Patient questions:  Do you have a fever, pain , or abdominal swelling? No. Pain Score  0 *  Have you tolerated food without any problems? Yes.    Have you been able to return to your normal activities? Yes.    Do you have any questions about your discharge instructions: Diet   No. Medications  No. Follow up visit  No.  Do you have questions or concerns about your Care? Yes.    Actions: * If pain score is 4 or above: No action needed, pain <4.

## 2018-03-13 NOTE — Progress Notes (Signed)
Reviewed and agree with documentation and assessment and plan. K. Veena Shylo Dillenbeck , MD   

## 2018-03-19 ENCOUNTER — Encounter: Payer: Self-pay | Admitting: Gastroenterology

## 2018-12-04 DIAGNOSIS — C50919 Malignant neoplasm of unspecified site of unspecified female breast: Secondary | ICD-10-CM

## 2018-12-04 HISTORY — DX: Malignant neoplasm of unspecified site of unspecified female breast: C50.919

## 2019-03-05 DIAGNOSIS — G8929 Other chronic pain: Secondary | ICD-10-CM

## 2019-03-05 HISTORY — DX: Other chronic pain: G89.29

## 2019-04-28 ENCOUNTER — Other Ambulatory Visit: Payer: Self-pay | Admitting: Otolaryngology

## 2019-04-28 ENCOUNTER — Ambulatory Visit
Admission: RE | Admit: 2019-04-28 | Discharge: 2019-04-28 | Disposition: A | Discharge status: Home / Self Care | Payer: 59 | Source: Ambulatory Visit | Attending: Otolaryngology | Admitting: Otolaryngology

## 2019-04-28 DIAGNOSIS — R221 Localized swelling, mass and lump, neck: Secondary | ICD-10-CM

## 2019-05-08 ENCOUNTER — Other Ambulatory Visit: Payer: Self-pay | Admitting: Otolaryngology

## 2019-05-08 DIAGNOSIS — R221 Localized swelling, mass and lump, neck: Secondary | ICD-10-CM

## 2019-09-28 DIAGNOSIS — F419 Anxiety disorder, unspecified: Secondary | ICD-10-CM | POA: Diagnosis not present

## 2019-09-28 DIAGNOSIS — F329 Major depressive disorder, single episode, unspecified: Secondary | ICD-10-CM | POA: Diagnosis not present

## 2019-10-08 DIAGNOSIS — F419 Anxiety disorder, unspecified: Secondary | ICD-10-CM | POA: Diagnosis not present

## 2019-10-08 DIAGNOSIS — F329 Major depressive disorder, single episode, unspecified: Secondary | ICD-10-CM | POA: Diagnosis not present

## 2019-10-15 DIAGNOSIS — Z1389 Encounter for screening for other disorder: Secondary | ICD-10-CM | POA: Diagnosis not present

## 2019-10-15 DIAGNOSIS — E538 Deficiency of other specified B group vitamins: Secondary | ICD-10-CM | POA: Diagnosis not present

## 2019-10-15 DIAGNOSIS — F064 Anxiety disorder due to known physiological condition: Secondary | ICD-10-CM | POA: Diagnosis not present

## 2019-10-15 DIAGNOSIS — F900 Attention-deficit hyperactivity disorder, predominantly inattentive type: Secondary | ICD-10-CM | POA: Diagnosis not present

## 2019-10-15 DIAGNOSIS — Z6824 Body mass index (BMI) 24.0-24.9, adult: Secondary | ICD-10-CM | POA: Diagnosis not present

## 2019-10-15 DIAGNOSIS — Z0000 Encounter for general adult medical examination without abnormal findings: Secondary | ICD-10-CM | POA: Diagnosis not present

## 2019-10-15 DIAGNOSIS — F331 Major depressive disorder, recurrent, moderate: Secondary | ICD-10-CM | POA: Diagnosis not present

## 2019-10-21 DIAGNOSIS — Z853 Personal history of malignant neoplasm of breast: Secondary | ICD-10-CM | POA: Diagnosis not present

## 2019-10-21 DIAGNOSIS — Z9013 Acquired absence of bilateral breasts and nipples: Secondary | ICD-10-CM | POA: Diagnosis not present

## 2019-10-22 DIAGNOSIS — F329 Major depressive disorder, single episode, unspecified: Secondary | ICD-10-CM | POA: Diagnosis not present

## 2019-10-22 DIAGNOSIS — F419 Anxiety disorder, unspecified: Secondary | ICD-10-CM | POA: Diagnosis not present

## 2019-10-30 DIAGNOSIS — U071 COVID-19: Secondary | ICD-10-CM | POA: Diagnosis not present

## 2019-12-01 DIAGNOSIS — F419 Anxiety disorder, unspecified: Secondary | ICD-10-CM | POA: Diagnosis not present

## 2019-12-01 DIAGNOSIS — F329 Major depressive disorder, single episode, unspecified: Secondary | ICD-10-CM | POA: Diagnosis not present

## 2019-12-02 DIAGNOSIS — Z01419 Encounter for gynecological examination (general) (routine) without abnormal findings: Secondary | ICD-10-CM | POA: Diagnosis not present

## 2019-12-02 DIAGNOSIS — Z1151 Encounter for screening for human papillomavirus (HPV): Secondary | ICD-10-CM | POA: Diagnosis not present

## 2019-12-29 DIAGNOSIS — F430 Acute stress reaction: Secondary | ICD-10-CM | POA: Diagnosis not present

## 2019-12-29 DIAGNOSIS — F900 Attention-deficit hyperactivity disorder, predominantly inattentive type: Secondary | ICD-10-CM | POA: Diagnosis not present

## 2019-12-29 DIAGNOSIS — Z6825 Body mass index (BMI) 25.0-25.9, adult: Secondary | ICD-10-CM | POA: Diagnosis not present

## 2019-12-30 DIAGNOSIS — Z3202 Encounter for pregnancy test, result negative: Secondary | ICD-10-CM | POA: Diagnosis not present

## 2019-12-30 DIAGNOSIS — F329 Major depressive disorder, single episode, unspecified: Secondary | ICD-10-CM | POA: Diagnosis not present

## 2019-12-30 DIAGNOSIS — F419 Anxiety disorder, unspecified: Secondary | ICD-10-CM | POA: Diagnosis not present

## 2019-12-30 DIAGNOSIS — Z30430 Encounter for insertion of intrauterine contraceptive device: Secondary | ICD-10-CM | POA: Diagnosis not present

## 2020-01-25 DIAGNOSIS — Z30431 Encounter for routine checking of intrauterine contraceptive device: Secondary | ICD-10-CM | POA: Diagnosis not present

## 2020-01-25 DIAGNOSIS — R5383 Other fatigue: Secondary | ICD-10-CM | POA: Diagnosis not present

## 2020-02-05 DIAGNOSIS — M9903 Segmental and somatic dysfunction of lumbar region: Secondary | ICD-10-CM | POA: Diagnosis not present

## 2020-02-05 DIAGNOSIS — M9902 Segmental and somatic dysfunction of thoracic region: Secondary | ICD-10-CM | POA: Diagnosis not present

## 2020-02-05 DIAGNOSIS — M9905 Segmental and somatic dysfunction of pelvic region: Secondary | ICD-10-CM | POA: Diagnosis not present

## 2020-02-05 DIAGNOSIS — M5451 Vertebrogenic low back pain: Secondary | ICD-10-CM | POA: Diagnosis not present

## 2020-02-08 DIAGNOSIS — M9905 Segmental and somatic dysfunction of pelvic region: Secondary | ICD-10-CM | POA: Diagnosis not present

## 2020-02-08 DIAGNOSIS — M9902 Segmental and somatic dysfunction of thoracic region: Secondary | ICD-10-CM | POA: Diagnosis not present

## 2020-02-08 DIAGNOSIS — M5451 Vertebrogenic low back pain: Secondary | ICD-10-CM | POA: Diagnosis not present

## 2020-02-08 DIAGNOSIS — M9903 Segmental and somatic dysfunction of lumbar region: Secondary | ICD-10-CM | POA: Diagnosis not present

## 2020-02-11 DIAGNOSIS — M5451 Vertebrogenic low back pain: Secondary | ICD-10-CM | POA: Diagnosis not present

## 2020-02-11 DIAGNOSIS — M9905 Segmental and somatic dysfunction of pelvic region: Secondary | ICD-10-CM | POA: Diagnosis not present

## 2020-02-11 DIAGNOSIS — M9903 Segmental and somatic dysfunction of lumbar region: Secondary | ICD-10-CM | POA: Diagnosis not present

## 2020-02-11 DIAGNOSIS — M9902 Segmental and somatic dysfunction of thoracic region: Secondary | ICD-10-CM | POA: Diagnosis not present

## 2020-02-15 DIAGNOSIS — E538 Deficiency of other specified B group vitamins: Secondary | ICD-10-CM | POA: Diagnosis not present

## 2020-02-15 DIAGNOSIS — F900 Attention-deficit hyperactivity disorder, predominantly inattentive type: Secondary | ICD-10-CM | POA: Diagnosis not present

## 2020-02-15 DIAGNOSIS — F321 Major depressive disorder, single episode, moderate: Secondary | ICD-10-CM | POA: Diagnosis not present

## 2020-02-15 DIAGNOSIS — Z6826 Body mass index (BMI) 26.0-26.9, adult: Secondary | ICD-10-CM | POA: Diagnosis not present

## 2020-02-16 DIAGNOSIS — M5451 Vertebrogenic low back pain: Secondary | ICD-10-CM | POA: Diagnosis not present

## 2020-02-16 DIAGNOSIS — M9902 Segmental and somatic dysfunction of thoracic region: Secondary | ICD-10-CM | POA: Diagnosis not present

## 2020-02-16 DIAGNOSIS — M9903 Segmental and somatic dysfunction of lumbar region: Secondary | ICD-10-CM | POA: Diagnosis not present

## 2020-02-16 DIAGNOSIS — M9905 Segmental and somatic dysfunction of pelvic region: Secondary | ICD-10-CM | POA: Diagnosis not present

## 2020-02-23 DIAGNOSIS — M5451 Vertebrogenic low back pain: Secondary | ICD-10-CM | POA: Diagnosis not present

## 2020-02-23 DIAGNOSIS — M9902 Segmental and somatic dysfunction of thoracic region: Secondary | ICD-10-CM | POA: Diagnosis not present

## 2020-02-23 DIAGNOSIS — M9905 Segmental and somatic dysfunction of pelvic region: Secondary | ICD-10-CM | POA: Diagnosis not present

## 2020-02-23 DIAGNOSIS — M9903 Segmental and somatic dysfunction of lumbar region: Secondary | ICD-10-CM | POA: Diagnosis not present

## 2020-03-02 DIAGNOSIS — M5451 Vertebrogenic low back pain: Secondary | ICD-10-CM | POA: Diagnosis not present

## 2020-03-02 DIAGNOSIS — M9905 Segmental and somatic dysfunction of pelvic region: Secondary | ICD-10-CM | POA: Diagnosis not present

## 2020-03-02 DIAGNOSIS — M9902 Segmental and somatic dysfunction of thoracic region: Secondary | ICD-10-CM | POA: Diagnosis not present

## 2020-03-02 DIAGNOSIS — M9903 Segmental and somatic dysfunction of lumbar region: Secondary | ICD-10-CM | POA: Diagnosis not present

## 2020-03-04 DIAGNOSIS — M5451 Vertebrogenic low back pain: Secondary | ICD-10-CM | POA: Diagnosis not present

## 2020-03-04 DIAGNOSIS — M9905 Segmental and somatic dysfunction of pelvic region: Secondary | ICD-10-CM | POA: Diagnosis not present

## 2020-03-04 DIAGNOSIS — M9903 Segmental and somatic dysfunction of lumbar region: Secondary | ICD-10-CM | POA: Diagnosis not present

## 2020-03-04 DIAGNOSIS — M9902 Segmental and somatic dysfunction of thoracic region: Secondary | ICD-10-CM | POA: Diagnosis not present

## 2020-03-09 DIAGNOSIS — M9905 Segmental and somatic dysfunction of pelvic region: Secondary | ICD-10-CM | POA: Diagnosis not present

## 2020-03-09 DIAGNOSIS — M5451 Vertebrogenic low back pain: Secondary | ICD-10-CM | POA: Diagnosis not present

## 2020-03-09 DIAGNOSIS — M9902 Segmental and somatic dysfunction of thoracic region: Secondary | ICD-10-CM | POA: Diagnosis not present

## 2020-03-09 DIAGNOSIS — M9903 Segmental and somatic dysfunction of lumbar region: Secondary | ICD-10-CM | POA: Diagnosis not present

## 2020-03-17 DIAGNOSIS — R300 Dysuria: Secondary | ICD-10-CM | POA: Diagnosis not present

## 2020-03-17 DIAGNOSIS — N3001 Acute cystitis with hematuria: Secondary | ICD-10-CM | POA: Diagnosis not present

## 2020-05-04 DIAGNOSIS — Z9013 Acquired absence of bilateral breasts and nipples: Secondary | ICD-10-CM | POA: Diagnosis not present

## 2020-05-04 DIAGNOSIS — Z853 Personal history of malignant neoplasm of breast: Secondary | ICD-10-CM | POA: Diagnosis not present

## 2020-06-27 DIAGNOSIS — M9903 Segmental and somatic dysfunction of lumbar region: Secondary | ICD-10-CM | POA: Diagnosis not present

## 2020-06-27 DIAGNOSIS — M5451 Vertebrogenic low back pain: Secondary | ICD-10-CM | POA: Diagnosis not present

## 2020-06-27 DIAGNOSIS — M9905 Segmental and somatic dysfunction of pelvic region: Secondary | ICD-10-CM | POA: Diagnosis not present

## 2020-06-27 DIAGNOSIS — M9902 Segmental and somatic dysfunction of thoracic region: Secondary | ICD-10-CM | POA: Diagnosis not present

## 2020-06-28 DIAGNOSIS — M9905 Segmental and somatic dysfunction of pelvic region: Secondary | ICD-10-CM | POA: Diagnosis not present

## 2020-06-28 DIAGNOSIS — M9902 Segmental and somatic dysfunction of thoracic region: Secondary | ICD-10-CM | POA: Diagnosis not present

## 2020-06-28 DIAGNOSIS — M9903 Segmental and somatic dysfunction of lumbar region: Secondary | ICD-10-CM | POA: Diagnosis not present

## 2020-06-28 DIAGNOSIS — M5451 Vertebrogenic low back pain: Secondary | ICD-10-CM | POA: Diagnosis not present

## 2020-07-25 DIAGNOSIS — M5451 Vertebrogenic low back pain: Secondary | ICD-10-CM | POA: Diagnosis not present

## 2020-07-25 DIAGNOSIS — M9903 Segmental and somatic dysfunction of lumbar region: Secondary | ICD-10-CM | POA: Diagnosis not present

## 2020-07-25 DIAGNOSIS — M9902 Segmental and somatic dysfunction of thoracic region: Secondary | ICD-10-CM | POA: Diagnosis not present

## 2020-07-25 DIAGNOSIS — M9905 Segmental and somatic dysfunction of pelvic region: Secondary | ICD-10-CM | POA: Diagnosis not present

## 2020-08-05 DIAGNOSIS — N390 Urinary tract infection, site not specified: Secondary | ICD-10-CM | POA: Diagnosis not present

## 2020-08-30 DIAGNOSIS — M9902 Segmental and somatic dysfunction of thoracic region: Secondary | ICD-10-CM | POA: Diagnosis not present

## 2020-08-30 DIAGNOSIS — M9905 Segmental and somatic dysfunction of pelvic region: Secondary | ICD-10-CM | POA: Diagnosis not present

## 2020-08-30 DIAGNOSIS — M5451 Vertebrogenic low back pain: Secondary | ICD-10-CM | POA: Diagnosis not present

## 2020-08-30 DIAGNOSIS — M9903 Segmental and somatic dysfunction of lumbar region: Secondary | ICD-10-CM | POA: Diagnosis not present

## 2020-09-21 DIAGNOSIS — F900 Attention-deficit hyperactivity disorder, predominantly inattentive type: Secondary | ICD-10-CM | POA: Diagnosis not present

## 2020-09-21 DIAGNOSIS — F331 Major depressive disorder, recurrent, moderate: Secondary | ICD-10-CM | POA: Diagnosis not present

## 2020-10-18 DIAGNOSIS — F331 Major depressive disorder, recurrent, moderate: Secondary | ICD-10-CM | POA: Diagnosis not present

## 2020-10-27 DIAGNOSIS — M5451 Vertebrogenic low back pain: Secondary | ICD-10-CM | POA: Diagnosis not present

## 2020-10-27 DIAGNOSIS — M9902 Segmental and somatic dysfunction of thoracic region: Secondary | ICD-10-CM | POA: Diagnosis not present

## 2020-10-27 DIAGNOSIS — M9905 Segmental and somatic dysfunction of pelvic region: Secondary | ICD-10-CM | POA: Diagnosis not present

## 2020-10-27 DIAGNOSIS — R519 Headache, unspecified: Secondary | ICD-10-CM | POA: Diagnosis not present

## 2020-10-27 DIAGNOSIS — M9903 Segmental and somatic dysfunction of lumbar region: Secondary | ICD-10-CM | POA: Diagnosis not present

## 2020-11-02 DIAGNOSIS — R519 Headache, unspecified: Secondary | ICD-10-CM | POA: Diagnosis not present

## 2020-11-15 DIAGNOSIS — Z975 Presence of (intrauterine) contraceptive device: Secondary | ICD-10-CM | POA: Diagnosis not present

## 2020-11-15 DIAGNOSIS — R5383 Other fatigue: Secondary | ICD-10-CM | POA: Diagnosis not present

## 2020-11-15 DIAGNOSIS — N921 Excessive and frequent menstruation with irregular cycle: Secondary | ICD-10-CM | POA: Diagnosis not present

## 2020-11-18 DIAGNOSIS — R5383 Other fatigue: Secondary | ICD-10-CM | POA: Diagnosis not present

## 2020-11-18 DIAGNOSIS — F419 Anxiety disorder, unspecified: Secondary | ICD-10-CM | POA: Diagnosis not present

## 2020-11-18 DIAGNOSIS — R635 Abnormal weight gain: Secondary | ICD-10-CM | POA: Diagnosis not present

## 2020-11-18 DIAGNOSIS — F32A Depression, unspecified: Secondary | ICD-10-CM | POA: Diagnosis not present

## 2020-11-24 DIAGNOSIS — R946 Abnormal results of thyroid function studies: Secondary | ICD-10-CM | POA: Diagnosis not present

## 2020-11-24 DIAGNOSIS — Z2821 Immunization not carried out because of patient refusal: Secondary | ICD-10-CM | POA: Diagnosis not present

## 2020-11-24 DIAGNOSIS — R519 Headache, unspecified: Secondary | ICD-10-CM | POA: Diagnosis not present

## 2020-11-24 DIAGNOSIS — R1084 Generalized abdominal pain: Secondary | ICD-10-CM | POA: Diagnosis not present

## 2020-11-24 DIAGNOSIS — R202 Paresthesia of skin: Secondary | ICD-10-CM | POA: Diagnosis not present

## 2020-11-24 DIAGNOSIS — R200 Anesthesia of skin: Secondary | ICD-10-CM | POA: Diagnosis not present

## 2020-11-24 DIAGNOSIS — R5383 Other fatigue: Secondary | ICD-10-CM | POA: Diagnosis not present

## 2020-11-29 DIAGNOSIS — M9905 Segmental and somatic dysfunction of pelvic region: Secondary | ICD-10-CM | POA: Diagnosis not present

## 2020-11-29 DIAGNOSIS — M9902 Segmental and somatic dysfunction of thoracic region: Secondary | ICD-10-CM | POA: Diagnosis not present

## 2020-11-29 DIAGNOSIS — M9903 Segmental and somatic dysfunction of lumbar region: Secondary | ICD-10-CM | POA: Diagnosis not present

## 2020-11-29 DIAGNOSIS — M5451 Vertebrogenic low back pain: Secondary | ICD-10-CM | POA: Diagnosis not present

## 2020-12-02 DIAGNOSIS — Z01419 Encounter for gynecological examination (general) (routine) without abnormal findings: Secondary | ICD-10-CM | POA: Diagnosis not present

## 2020-12-02 DIAGNOSIS — Z1151 Encounter for screening for human papillomavirus (HPV): Secondary | ICD-10-CM | POA: Diagnosis not present

## 2020-12-26 DIAGNOSIS — Z2821 Immunization not carried out because of patient refusal: Secondary | ICD-10-CM | POA: Diagnosis not present

## 2020-12-26 DIAGNOSIS — F331 Major depressive disorder, recurrent, moderate: Secondary | ICD-10-CM | POA: Diagnosis not present

## 2020-12-26 DIAGNOSIS — R519 Headache, unspecified: Secondary | ICD-10-CM | POA: Diagnosis not present

## 2020-12-26 DIAGNOSIS — R946 Abnormal results of thyroid function studies: Secondary | ICD-10-CM | POA: Diagnosis not present

## 2020-12-26 DIAGNOSIS — R1084 Generalized abdominal pain: Secondary | ICD-10-CM | POA: Diagnosis not present

## 2021-01-03 DIAGNOSIS — M9905 Segmental and somatic dysfunction of pelvic region: Secondary | ICD-10-CM | POA: Diagnosis not present

## 2021-01-03 DIAGNOSIS — M9902 Segmental and somatic dysfunction of thoracic region: Secondary | ICD-10-CM | POA: Diagnosis not present

## 2021-01-03 DIAGNOSIS — M9903 Segmental and somatic dysfunction of lumbar region: Secondary | ICD-10-CM | POA: Diagnosis not present

## 2021-01-03 DIAGNOSIS — M5451 Vertebrogenic low back pain: Secondary | ICD-10-CM | POA: Diagnosis not present

## 2021-01-17 DIAGNOSIS — F988 Other specified behavioral and emotional disorders with onset usually occurring in childhood and adolescence: Secondary | ICD-10-CM | POA: Diagnosis not present

## 2021-01-17 DIAGNOSIS — E559 Vitamin D deficiency, unspecified: Secondary | ICD-10-CM | POA: Diagnosis not present

## 2021-01-17 DIAGNOSIS — E039 Hypothyroidism, unspecified: Secondary | ICD-10-CM | POA: Diagnosis not present

## 2021-01-17 DIAGNOSIS — R209 Unspecified disturbances of skin sensation: Secondary | ICD-10-CM | POA: Diagnosis not present

## 2021-01-31 DIAGNOSIS — M9905 Segmental and somatic dysfunction of pelvic region: Secondary | ICD-10-CM | POA: Diagnosis not present

## 2021-01-31 DIAGNOSIS — M9902 Segmental and somatic dysfunction of thoracic region: Secondary | ICD-10-CM | POA: Diagnosis not present

## 2021-01-31 DIAGNOSIS — M9903 Segmental and somatic dysfunction of lumbar region: Secondary | ICD-10-CM | POA: Diagnosis not present

## 2021-01-31 DIAGNOSIS — M5451 Vertebrogenic low back pain: Secondary | ICD-10-CM | POA: Diagnosis not present

## 2021-02-02 HISTORY — PX: TOTAL VAGINAL HYSTERECTOMY: SHX2548

## 2021-02-08 DIAGNOSIS — R102 Pelvic and perineal pain: Secondary | ICD-10-CM | POA: Diagnosis not present

## 2021-02-08 DIAGNOSIS — Z853 Personal history of malignant neoplasm of breast: Secondary | ICD-10-CM | POA: Diagnosis not present

## 2021-02-16 DIAGNOSIS — D649 Anemia, unspecified: Secondary | ICD-10-CM | POA: Diagnosis not present

## 2021-02-16 DIAGNOSIS — D251 Intramural leiomyoma of uterus: Secondary | ICD-10-CM | POA: Diagnosis not present

## 2021-02-16 DIAGNOSIS — N8000 Endometriosis of the uterus, unspecified: Secondary | ICD-10-CM | POA: Diagnosis not present

## 2021-02-16 DIAGNOSIS — N72 Inflammatory disease of cervix uteri: Secondary | ICD-10-CM | POA: Diagnosis not present

## 2021-02-16 DIAGNOSIS — R102 Pelvic and perineal pain: Secondary | ICD-10-CM | POA: Diagnosis not present

## 2021-02-16 DIAGNOSIS — N8312 Corpus luteum cyst of left ovary: Secondary | ICD-10-CM | POA: Diagnosis not present

## 2021-02-16 DIAGNOSIS — Z853 Personal history of malignant neoplasm of breast: Secondary | ICD-10-CM | POA: Diagnosis not present

## 2021-02-16 DIAGNOSIS — N80103 Endometriosis of bilateral ovaries, unspecified depth: Secondary | ICD-10-CM | POA: Diagnosis not present

## 2021-02-16 DIAGNOSIS — K219 Gastro-esophageal reflux disease without esophagitis: Secondary | ICD-10-CM | POA: Diagnosis not present

## 2021-02-16 DIAGNOSIS — N8311 Corpus luteum cyst of right ovary: Secondary | ICD-10-CM | POA: Diagnosis not present

## 2021-02-16 DIAGNOSIS — N926 Irregular menstruation, unspecified: Secondary | ICD-10-CM | POA: Diagnosis not present

## 2021-02-17 DIAGNOSIS — N926 Irregular menstruation, unspecified: Secondary | ICD-10-CM | POA: Diagnosis not present

## 2021-02-17 DIAGNOSIS — Z853 Personal history of malignant neoplasm of breast: Secondary | ICD-10-CM | POA: Diagnosis not present

## 2021-02-17 DIAGNOSIS — K219 Gastro-esophageal reflux disease without esophagitis: Secondary | ICD-10-CM | POA: Diagnosis not present

## 2021-02-17 DIAGNOSIS — D649 Anemia, unspecified: Secondary | ICD-10-CM | POA: Diagnosis not present

## 2021-02-17 DIAGNOSIS — N8000 Endometriosis of the uterus, unspecified: Secondary | ICD-10-CM | POA: Diagnosis not present

## 2021-02-17 DIAGNOSIS — R102 Pelvic and perineal pain: Secondary | ICD-10-CM | POA: Diagnosis not present

## 2021-03-19 IMAGING — US US SOFT TISSUE HEAD/NECK
1 series · 7 of 7 positions shown · non-contrast
Comparison: None.

CLINICAL DATA: 43-year-old female with a history of neck mass

EXAM:
ULTRASOUND OF HEAD/NECK SOFT TISSUES
TECHNIQUE: Ultrasound examination of the head and neck soft tissues was
performed in the area of clinical concern.

[Series 1: us soft tissue head/neck · 0.02mm/px · 7 of 7 slices shown]
[im 1/7]
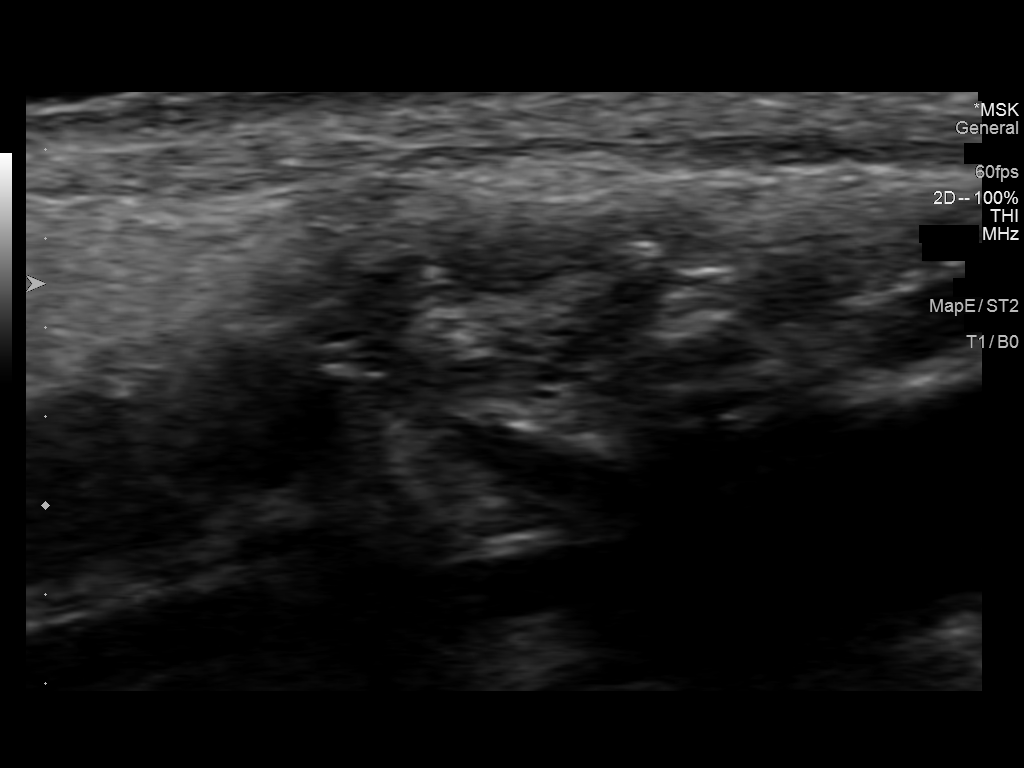
[im 2/7]
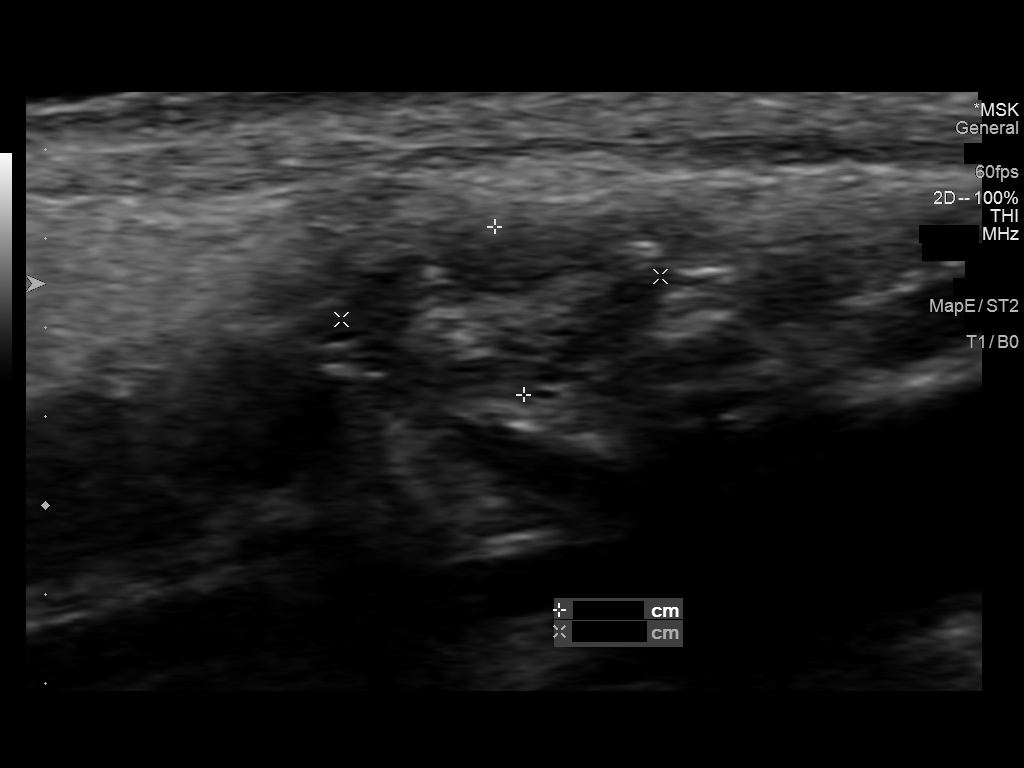
[im 3/7]
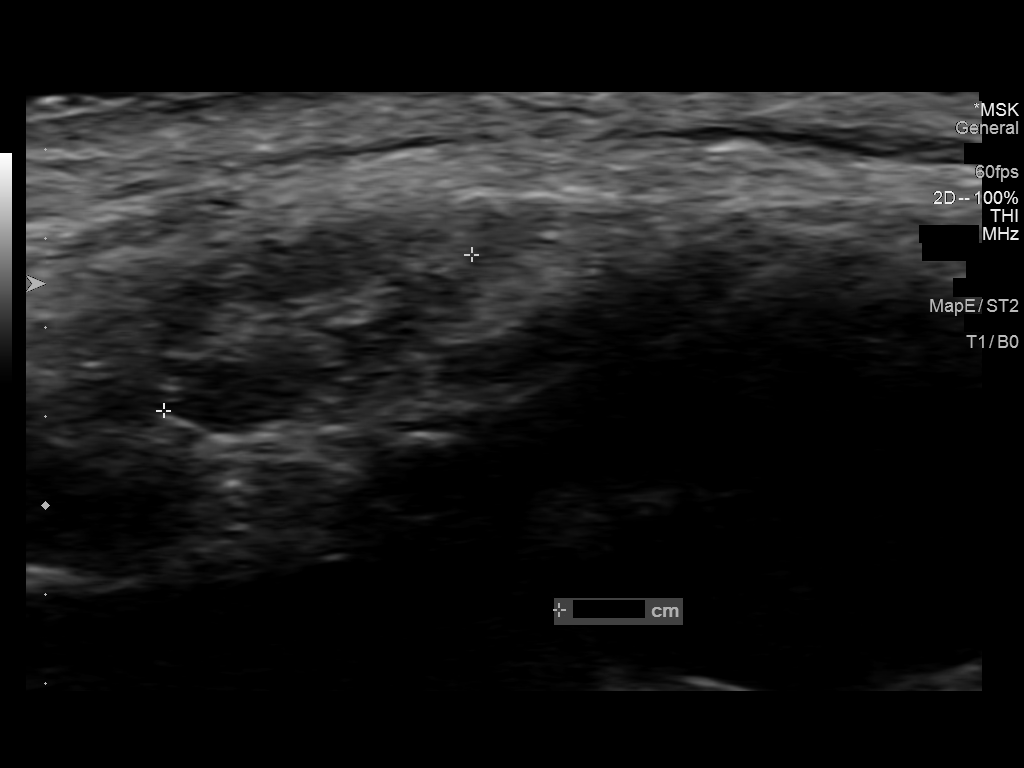
[im 4/7]
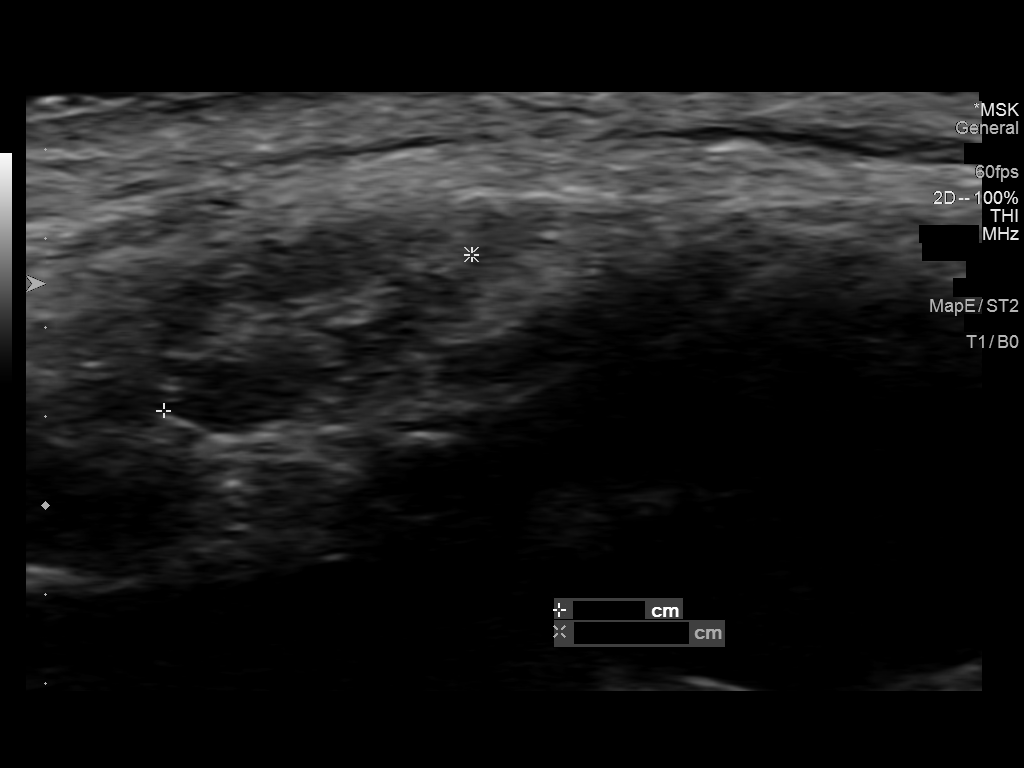
[im 5/7]
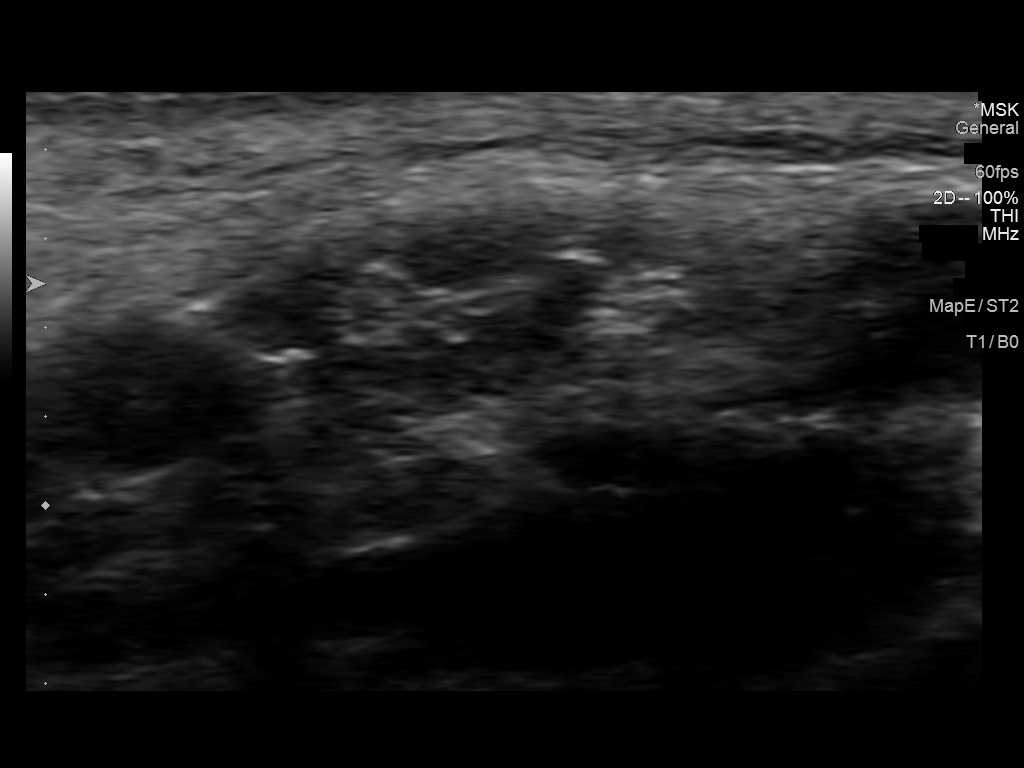
[im 6/7]
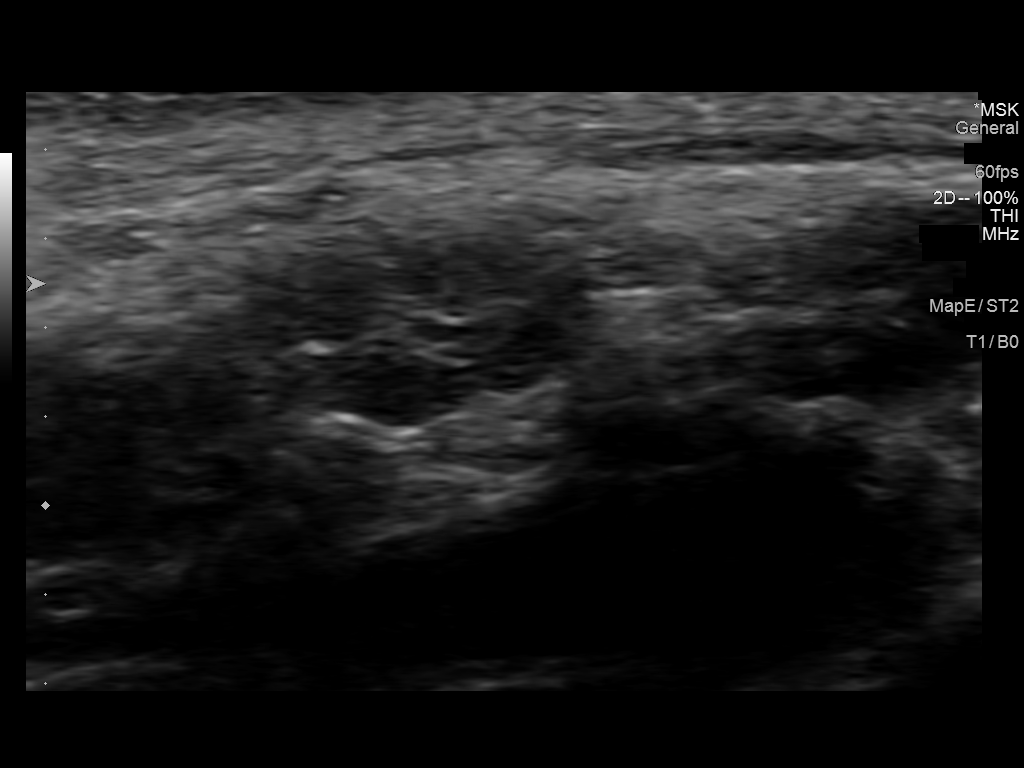
[im 7/7]
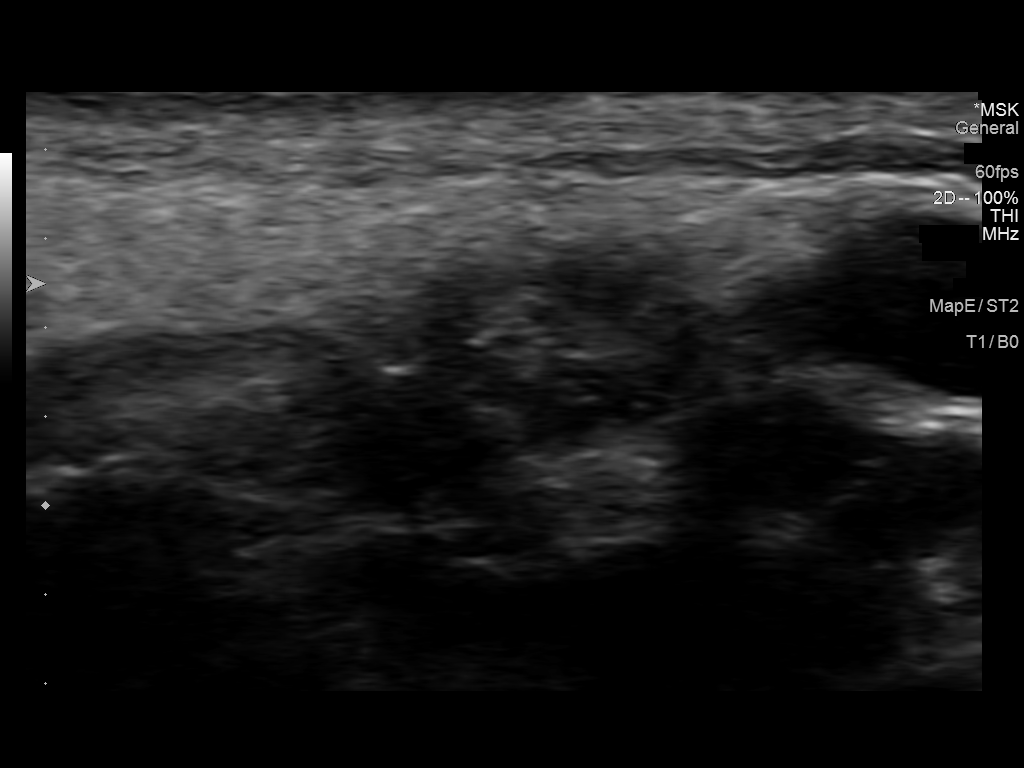

[7 of 7 positions shown; findings below may reference images not displayed]

FINDINGS: Grayscale and color duplex performed in the region of clinical
concern.

There is an ill-defined heterogeneously hypoechoic soft tissue
lesion measuring 4 mm x 7 mm x 8 mm in the submandibular region on
the right.
IMPRESSION: Ill-defined soft tissue lesion in the region clinical concern
measures 8 mm and may represent a lymph node potentially reactive.
Clinical follow-up recommended, and if there is concern for a
history of malignancy, further evaluation with contrast-enhanced
neck CT.

## 2021-03-23 DIAGNOSIS — N3001 Acute cystitis with hematuria: Secondary | ICD-10-CM | POA: Diagnosis not present

## 2021-03-30 DIAGNOSIS — F411 Generalized anxiety disorder: Secondary | ICD-10-CM | POA: Diagnosis not present

## 2021-03-30 DIAGNOSIS — F900 Attention-deficit hyperactivity disorder, predominantly inattentive type: Secondary | ICD-10-CM | POA: Diagnosis not present

## 2021-03-30 DIAGNOSIS — F330 Major depressive disorder, recurrent, mild: Secondary | ICD-10-CM | POA: Diagnosis not present

## 2021-04-19 DIAGNOSIS — F411 Generalized anxiety disorder: Secondary | ICD-10-CM | POA: Diagnosis not present

## 2021-04-19 DIAGNOSIS — F900 Attention-deficit hyperactivity disorder, predominantly inattentive type: Secondary | ICD-10-CM | POA: Diagnosis not present

## 2021-04-19 DIAGNOSIS — F330 Major depressive disorder, recurrent, mild: Secondary | ICD-10-CM | POA: Diagnosis not present

## 2021-04-26 DIAGNOSIS — F411 Generalized anxiety disorder: Secondary | ICD-10-CM | POA: Diagnosis not present

## 2021-04-26 DIAGNOSIS — F330 Major depressive disorder, recurrent, mild: Secondary | ICD-10-CM | POA: Diagnosis not present

## 2021-04-26 DIAGNOSIS — F900 Attention-deficit hyperactivity disorder, predominantly inattentive type: Secondary | ICD-10-CM | POA: Diagnosis not present

## 2021-05-03 DIAGNOSIS — F330 Major depressive disorder, recurrent, mild: Secondary | ICD-10-CM | POA: Diagnosis not present

## 2021-05-03 DIAGNOSIS — F411 Generalized anxiety disorder: Secondary | ICD-10-CM | POA: Diagnosis not present

## 2021-05-03 DIAGNOSIS — F900 Attention-deficit hyperactivity disorder, predominantly inattentive type: Secondary | ICD-10-CM | POA: Diagnosis not present

## 2021-05-10 DIAGNOSIS — F900 Attention-deficit hyperactivity disorder, predominantly inattentive type: Secondary | ICD-10-CM | POA: Diagnosis not present

## 2021-05-10 DIAGNOSIS — F411 Generalized anxiety disorder: Secondary | ICD-10-CM | POA: Diagnosis not present

## 2021-05-10 DIAGNOSIS — F330 Major depressive disorder, recurrent, mild: Secondary | ICD-10-CM | POA: Diagnosis not present

## 2021-05-24 DIAGNOSIS — F330 Major depressive disorder, recurrent, mild: Secondary | ICD-10-CM | POA: Diagnosis not present

## 2021-05-24 DIAGNOSIS — F900 Attention-deficit hyperactivity disorder, predominantly inattentive type: Secondary | ICD-10-CM | POA: Diagnosis not present

## 2021-05-24 DIAGNOSIS — F411 Generalized anxiety disorder: Secondary | ICD-10-CM | POA: Diagnosis not present

## 2021-06-01 DIAGNOSIS — J069 Acute upper respiratory infection, unspecified: Secondary | ICD-10-CM | POA: Diagnosis not present

## 2021-06-07 DIAGNOSIS — F330 Major depressive disorder, recurrent, mild: Secondary | ICD-10-CM | POA: Diagnosis not present

## 2021-06-07 DIAGNOSIS — F900 Attention-deficit hyperactivity disorder, predominantly inattentive type: Secondary | ICD-10-CM | POA: Diagnosis not present

## 2021-06-07 DIAGNOSIS — F411 Generalized anxiety disorder: Secondary | ICD-10-CM | POA: Diagnosis not present

## 2021-06-21 DIAGNOSIS — F411 Generalized anxiety disorder: Secondary | ICD-10-CM | POA: Diagnosis not present

## 2021-06-21 DIAGNOSIS — F330 Major depressive disorder, recurrent, mild: Secondary | ICD-10-CM | POA: Diagnosis not present

## 2021-06-21 DIAGNOSIS — F900 Attention-deficit hyperactivity disorder, predominantly inattentive type: Secondary | ICD-10-CM | POA: Diagnosis not present

## 2021-06-28 DIAGNOSIS — F411 Generalized anxiety disorder: Secondary | ICD-10-CM | POA: Diagnosis not present

## 2021-06-28 DIAGNOSIS — F900 Attention-deficit hyperactivity disorder, predominantly inattentive type: Secondary | ICD-10-CM | POA: Diagnosis not present

## 2021-06-28 DIAGNOSIS — F330 Major depressive disorder, recurrent, mild: Secondary | ICD-10-CM | POA: Diagnosis not present

## 2021-07-05 DIAGNOSIS — F330 Major depressive disorder, recurrent, mild: Secondary | ICD-10-CM | POA: Diagnosis not present

## 2021-07-05 DIAGNOSIS — F900 Attention-deficit hyperactivity disorder, predominantly inattentive type: Secondary | ICD-10-CM | POA: Diagnosis not present

## 2021-07-05 DIAGNOSIS — F411 Generalized anxiety disorder: Secondary | ICD-10-CM | POA: Diagnosis not present

## 2021-07-19 DIAGNOSIS — F900 Attention-deficit hyperactivity disorder, predominantly inattentive type: Secondary | ICD-10-CM | POA: Diagnosis not present

## 2021-07-19 DIAGNOSIS — F330 Major depressive disorder, recurrent, mild: Secondary | ICD-10-CM | POA: Diagnosis not present

## 2021-07-19 DIAGNOSIS — F411 Generalized anxiety disorder: Secondary | ICD-10-CM | POA: Diagnosis not present

## 2021-07-20 DIAGNOSIS — R635 Abnormal weight gain: Secondary | ICD-10-CM | POA: Diagnosis not present

## 2021-07-20 DIAGNOSIS — Z13220 Encounter for screening for lipoid disorders: Secondary | ICD-10-CM | POA: Diagnosis not present

## 2021-07-20 DIAGNOSIS — E039 Hypothyroidism, unspecified: Secondary | ICD-10-CM | POA: Diagnosis not present

## 2021-07-20 DIAGNOSIS — Z853 Personal history of malignant neoplasm of breast: Secondary | ICD-10-CM | POA: Diagnosis not present

## 2021-07-25 DIAGNOSIS — L814 Other melanin hyperpigmentation: Secondary | ICD-10-CM | POA: Diagnosis not present

## 2021-07-25 DIAGNOSIS — L821 Other seborrheic keratosis: Secondary | ICD-10-CM | POA: Diagnosis not present

## 2021-07-25 DIAGNOSIS — D485 Neoplasm of uncertain behavior of skin: Secondary | ICD-10-CM | POA: Diagnosis not present

## 2021-07-25 DIAGNOSIS — D2239 Melanocytic nevi of other parts of face: Secondary | ICD-10-CM | POA: Diagnosis not present

## 2021-07-25 DIAGNOSIS — D225 Melanocytic nevi of trunk: Secondary | ICD-10-CM | POA: Diagnosis not present

## 2021-07-26 DIAGNOSIS — F330 Major depressive disorder, recurrent, mild: Secondary | ICD-10-CM | POA: Diagnosis not present

## 2021-07-26 DIAGNOSIS — F900 Attention-deficit hyperactivity disorder, predominantly inattentive type: Secondary | ICD-10-CM | POA: Diagnosis not present

## 2021-07-26 DIAGNOSIS — F411 Generalized anxiety disorder: Secondary | ICD-10-CM | POA: Diagnosis not present

## 2021-12-19 ENCOUNTER — Ambulatory Visit: Payer: Commercial Managed Care - PPO | Admitting: Podiatry

## 2022-01-23 ENCOUNTER — Encounter: Payer: Self-pay | Admitting: Nurse Practitioner

## 2022-03-05 DIAGNOSIS — R200 Anesthesia of skin: Secondary | ICD-10-CM | POA: Insufficient documentation

## 2022-03-05 HISTORY — DX: Anesthesia of skin: R20.0

## 2022-03-07 ENCOUNTER — Ambulatory Visit: Payer: 59 | Admitting: Nurse Practitioner

## 2022-09-20 DIAGNOSIS — I951 Orthostatic hypotension: Secondary | ICD-10-CM | POA: Diagnosis not present

## 2022-10-01 NOTE — Progress Notes (Signed)
[facility]  PATIENT: Pamela Mcdowell DOB: [birth year]  REFERRING DOCTOR OR PCP:  Dr. Sudie Bailey SOURCE: Patient, note from primary care, imaging and lab reports, CT scan personally reviewed.  _________________________________   HISTORICAL  CHIEF COMPLAINT:  Chief Complaint  Patient presents with   Room 11    Pt is here Alone. Pt states that she has numbness in her hands and feet. Pt states that she passed out last June.     HISTORY OF PRESENT ILLNESS:  I had the pleasure of seeing a patient, Pamela Mcdowell, at [facility] for neurologic consultation regarding her numbness and episode of leg weakness.  She is a 47 year old woman who had the onset of left 4th and 5th finger numbness in 2022.  This has been persistent.  However, more recently she has had more numbness in her hands as well, that often awakens her at night.      In late May 2024, she had an episode of right leg weakness and her husband needed to half carry her to the car.  She also had leg numbness.  There was no precipitating activity.  A little later that day, she had multiple episodes of vomiting.  She had a HA that day but no photophobia.     She felt weak the next day in general.   She still notes numbness off/on in the legs when she crosses them but there is no numbness that stays and strength is fine.     She had a recent brain MR and was told it was normal.    She reports some neck pain and stiffness. She had no Lhermitte sign  On June 14th she stood up at night and passed out .  She went to the ED.   BP was low and she had an Echocardiogram and was told it was normal.   She does note presyncope occasionally over past many years.  She has a tremor in her hands at times that comes and goes.   This is not necessarily with intention.  She notes a lot of muscle cramps.   She sometimes smells odors that are not perceived by others.     Vision is fine.    She had breast cancer in  2020 with double mastectomy.    She notes some urinary hesitancy and retention and has frequent UTI.     Imaging: CT scan of the head 11/22/2021 was normal.  REVIEW OF SYSTEMS: Constitutional: No fevers, chills, sweats, or change in appetite Eyes: No visual changes, double vision, eye pain Ear, nose and throat: No hearing loss, ear pain, nasal congestion, sore throat Cardiovascular: No chest pain, palpitations Respiratory:  No shortness of breath at rest or with exertion.   No wheezes GastrointestinaI: No nausea, vomiting, diarrhea, abdominal pain, fecal incontinence Genitourinary:  No dysuria, urinary retention or frequency.  No nocturia. Musculoskeletal:  No neck pain, back pain Integumentary: No rash, pruritus, skin lesions Neurological: as above Psychiatric: No depression at this time.  No anxiety.  She is on Effexor. Endocrine: No palpitations, diaphoresis, change in appetite, change in weigh or increased thirst Hematologic/Lymphatic:  No anemia, purpura, petechiae. Allergic/Immunologic: No itchy/runny eyes, nasal congestion, recent allergic reactions, rashes  ALLERGIES: Not on File  HOME MEDICATIONS:  Current Outpatient Medications:    methylphenidate 36 MG PO CR tablet, Take 36 mg by mouth daily., Disp: , Rfl:    nitrofurantoin, macrocrystal-monohydrate, (MACROBID) 100 MG capsule, Take 100 mg by mouth 2 (  two) times daily., Disp: , Rfl:    Semaglutide,0.25 or 0.5MG /DOS, 2 MG/1.5ML SOPN, Inject into the skin., Disp: , Rfl:    venlafaxine XR (EFFEXOR-XR) 150 MG 24 hr capsule, Take 150 mg by mouth daily with breakfast., Disp: , Rfl:   PAST MEDICAL HISTORY: Past Medical History:  Diagnosis Date   Anxiety    Breast cancer (HCC) 12/2018   Depression    GERD (gastroesophageal reflux disease)     PAST SURGICAL HISTORY: Past Surgical History:  Procedure Laterality Date   CHOLECYSTECTOMY     COLONOSCOPY  07/21/2009   TONSILLECTOMY     TOTAL VAGINAL HYSTERECTOMY  02/2021     FAMILY HISTORY: Family History  Problem Relation Age of Onset   Colon cancer Neg Hx    Esophageal cancer Neg Hx    Stomach cancer Neg Hx    Rectal cancer Neg Hx     SOCIAL HISTORY: Social History   Socioeconomic History   Marital status: Married    Spouse name: Not on file   Number of children: 1   Years of education: Not on file   Highest education level: Not on file  Occupational History   Occupation: Controller   Tobacco Use   Smoking status: Never   Smokeless tobacco: Never  Vaping Use   Vaping status: Never Used  Substance and Sexual Activity   Alcohol use: Yes    Alcohol/week: 7.0 standard drinks of alcohol    Types: 7 Glasses of wine per week   Drug use: Never   Sexual activity: Not on file  Other Topics Concern   Not on file  Social History Narrative   Right Handed    Occasional Soda   Social Determinants of Health   Financial Resource Strain: Not on file  Food Insecurity: Not on file  Transportation Needs: Not on file  Physical Activity: Not on file  Stress: Not on file  Social Connections: Not on file  Intimate Partner Violence: Not on file       PHYSICAL EXAM  Vitals:   10/03/22 1558  BP: 131/74  Pulse: 72  Weight: 154 lb 8 oz (70.1 kg)  Height: 5\' 6"  (1.676 m)    Body mass index is 24.94 kg/m.   General: The patient is well-developed and well-nourished and in no acute distress  HEENT:  Head is [place]/AT.  Sclera are anicteric.    Neck: No carotid bruits are noted.  The neck is nontender.  Cardiovascular: The heart has a regular rate and rhythm with a normal S1 and S2. There were no murmurs, gallops or rubs.    Skin: Extremities are without rash or  edema.  Musculoskeletal:  Back is nontender  Neurologic Exam  Mental status: The patient is alert and oriented x 3 at the time of the examination. The patient has apparent normal recent and remote memory, with an apparently normal attention span and concentration ability.   Speech  is normal.  Cranial nerves: Extraocular movements are full. Pupils are equal, round, and reactive to light and accomodation.  Visual fields are full.  Facial symmetry is present. There is good facial sensation to soft touch bilaterally.Facial strength is normal.  Trapezius and sternocleidomastoid strength is normal. No dysarthria is noted.  The tongue is midline, and the patient has symmetric elevation of the soft palate. No obvious hearing deficits are noted.  Motor:  Muscle bulk is normal.   Tone is normal. Strength is  5 / 5 in all 4 extremities.  Sensory: Sensory testing is intact to pinprick, soft touch and vibration sensation in all 4 extremities.  Other: She has mild Tinel signs at the elbow.  Coordination: Cerebellar testing reveals good finger-nose-finger and heel-to-shin bilaterally.  Gait and station: Station is normal.   Gait is normal. Tandem gait is mildly wide. Romberg is negative.   Reflexes: Deep tendon reflexes are symmetric and normal in arms but 3++ crossed adductors at knees, L>R, and 2 beats nonsustained clonus.   Plantar responses are flexor.    DIAGNOSTIC DATA (LABS, IMAGING, TESTING) - I reviewed patient records, labs, notes, testing and imaging myself where available.  Lab Results  Component Value Date   WBC 5.1 07/04/2009   HGB 12.0 07/04/2009   HCT 35.3 (L) 07/04/2009   MCV 90.9 07/04/2009   PLT 237.0 07/04/2009      Component Value Date/Time   NA 143 07/04/2009 1512   K 3.8 07/04/2009 1512   CL 105 07/04/2009 1512   CO2 33 (H) 07/04/2009 1512   GLUCOSE 90 07/04/2009 1512   BUN 12 07/04/2009 1512   CREATININE 0.7 07/04/2009 1512   CALCIUM 9.6 07/04/2009 1512   PROT 6.6 07/04/2009 1512   ALBUMIN 4.4 07/04/2009 1512   AST 30 07/04/2009 1512   ALT 18 07/04/2009 1512   ALKPHOS 45 07/04/2009 1512   BILITOT 0.5 07/04/2009 1512   GFRNONAA 103.30 07/04/2009 1512        ASSESSMENT AND PLAN  Weakness of right lower extremity - Plan: MR THORACIC  SPINE WO CONTRAST  Cervicalgia - Plan: MR CERVICAL SPINE WO CONTRAST  Gait disturbance - Plan: MR THORACIC SPINE WO CONTRAST, Copper, serum  Hyperreflexia - Plan: MR THORACIC SPINE WO CONTRAST, Vitamin B12, Multiple Myeloma Panel (SPEP&IFE w/QIG), Copper, serum  Numbness - Plan: Vitamin B12, Multiple Myeloma Panel (SPEP&IFE w/QIG), Copper, serum  Malignant neoplasm of female breast, unspecified estrogen receptor status, unspecified laterality, unspecified site of breast (HCC)  Frequent UTI  In summary, Pamela Mcdowell is a 47 year old woman who has had numbness in the fourth and fifth finger of the left hand for several years and chronic neck pain.  She had a more recent episode of right leg weakness.  On her exam, she has hyperreflexia in the legs and her tandem gait was mildly wide.  Because of the combination of her symptoms and the abnormal findings on her neurologic examination, we need to check an MRI of the cervical and thoracic spine to determine if there is spinal cord pathology (either intrinsic or extrinsic myelopathy or other issues).  Since her current sensory symptoms are not painful there is no need for any medication at this time.  We will let her know the results of the MRI and do further evaluation and treatment based on the findings.  I did not schedule follow-up but she will call if she has any new or worsening neurologic symptoms.  Thank you for asking me to see this patient.  Please let me know if I can be of further assistance with her or other patients in the future.   Pamela Tarrant A. Epimenio Foot, MD, [facility] 10/03/2022, 10:20 PM Certified in Neurology, Clinical Neurophysiology, Sleep Medicine and Neuroimaging  [facility] [address], [address] [place], [place] [address] [phone]

## 2022-10-03 ENCOUNTER — Ambulatory Visit: Payer: Commercial Managed Care - PPO | Admitting: Neurology

## 2022-10-03 ENCOUNTER — Encounter: Payer: Self-pay | Admitting: Neurology

## 2022-10-03 VITALS — BP 131/74 | HR 72 | Ht 66.0 in | Wt 154.5 lb

## 2022-10-03 DIAGNOSIS — M542 Cervicalgia: Secondary | ICD-10-CM

## 2022-10-03 DIAGNOSIS — R200 Anesthesia of skin: Secondary | ICD-10-CM

## 2022-10-03 DIAGNOSIS — N390 Urinary tract infection, site not specified: Secondary | ICD-10-CM

## 2022-10-03 DIAGNOSIS — R292 Abnormal reflex: Secondary | ICD-10-CM | POA: Diagnosis not present

## 2022-10-03 DIAGNOSIS — R269 Unspecified abnormalities of gait and mobility: Secondary | ICD-10-CM

## 2022-10-03 DIAGNOSIS — R29898 Other symptoms and signs involving the musculoskeletal system: Secondary | ICD-10-CM | POA: Diagnosis not present

## 2022-10-03 DIAGNOSIS — C50919 Malignant neoplasm of unspecified site of unspecified female breast: Secondary | ICD-10-CM

## 2022-10-09 ENCOUNTER — Encounter: Payer: Self-pay | Admitting: Neurology

## 2022-10-09 ENCOUNTER — Telehealth: Payer: Self-pay | Admitting: Neurology

## 2022-10-09 DIAGNOSIS — R200 Anesthesia of skin: Secondary | ICD-10-CM

## 2022-10-09 DIAGNOSIS — R292 Abnormal reflex: Secondary | ICD-10-CM

## 2022-10-09 DIAGNOSIS — C801 Malignant (primary) neoplasm, unspecified: Secondary | ICD-10-CM

## 2022-10-09 DIAGNOSIS — M4802 Spinal stenosis, cervical region: Secondary | ICD-10-CM

## 2022-10-09 DIAGNOSIS — M50222 Other cervical disc displacement at C5-C6 level: Secondary | ICD-10-CM

## 2022-10-09 DIAGNOSIS — F419 Anxiety disorder, unspecified: Secondary | ICD-10-CM | POA: Insufficient documentation

## 2022-10-09 HISTORY — DX: Malignant (primary) neoplasm, unspecified: C80.1

## 2022-10-09 NOTE — Telephone Encounter (Signed)
Pt scheduled for 60 min MR cervical wo & MR thoracic spine wo contrast at [facility] for 10/10/22 at 12pm  [facility] NPR case# [id number]

## 2022-10-10 ENCOUNTER — Ambulatory Visit (INDEPENDENT_AMBULATORY_CARE_PROVIDER_SITE_OTHER): Payer: Commercial Managed Care - PPO

## 2022-10-10 DIAGNOSIS — M542 Cervicalgia: Secondary | ICD-10-CM

## 2022-10-10 DIAGNOSIS — R269 Unspecified abnormalities of gait and mobility: Secondary | ICD-10-CM

## 2022-10-10 DIAGNOSIS — R292 Abnormal reflex: Secondary | ICD-10-CM

## 2022-10-10 DIAGNOSIS — R29898 Other symptoms and signs involving the musculoskeletal system: Secondary | ICD-10-CM

## 2022-10-11 NOTE — Telephone Encounter (Signed)
I reviewed the MRI of the brain from 08/08/2022.  It is normal.  She also had MRI of the cervical and thoracic spine.  She has moderate spinal stenosis at C5-C6 due to a right paramedian disc herniation.  There is also moderate foraminal narrowing that could affect the C6 nerve root.  No myelomalacia was noted.  Because she has hyperreflexia and notes numbness in her legs, I am going to have her see neurosurgery about the changes at C5-C6 as I would be concerned that if these progressed she could have more symptoms.

## 2022-10-16 ENCOUNTER — Telehealth: Payer: Self-pay | Admitting: Neurology

## 2022-10-16 NOTE — Telephone Encounter (Signed)
Referral for neurosurgery fax to [facility]. Phone: [phone], [phone]

## 2022-10-17 ENCOUNTER — Encounter: Payer: Self-pay | Admitting: Neurology

## 2022-10-18 ENCOUNTER — Other Ambulatory Visit: Payer: Self-pay | Admitting: Neurology

## 2022-10-18 DIAGNOSIS — C50919 Malignant neoplasm of unspecified site of unspecified female breast: Secondary | ICD-10-CM

## 2022-10-18 DIAGNOSIS — R29898 Other symptoms and signs involving the musculoskeletal system: Secondary | ICD-10-CM

## 2022-10-18 DIAGNOSIS — M5416 Radiculopathy, lumbar region: Secondary | ICD-10-CM

## 2022-10-18 DIAGNOSIS — R200 Anesthesia of skin: Secondary | ICD-10-CM

## 2022-10-23 ENCOUNTER — Ambulatory Visit (INDEPENDENT_AMBULATORY_CARE_PROVIDER_SITE_OTHER): Payer: Commercial Managed Care - PPO

## 2022-10-23 DIAGNOSIS — R200 Anesthesia of skin: Secondary | ICD-10-CM

## 2022-10-23 DIAGNOSIS — R202 Paresthesia of skin: Secondary | ICD-10-CM

## 2022-10-23 DIAGNOSIS — M5416 Radiculopathy, lumbar region: Secondary | ICD-10-CM | POA: Diagnosis not present

## 2022-10-24 ENCOUNTER — Telehealth: Payer: Self-pay

## 2022-10-24 NOTE — Telephone Encounter (Signed)
Call to patient, results reviewed with her per Dr. Epimenio Foot. She is seeing Dr. Lovell Sheehan at [facility]  on Friday 8/23. She doesn't want the gabapentin at this time but will let us know if changes her mind.

## 2022-10-24 NOTE — Telephone Encounter (Signed)
-----   Message from Asa Lente sent at 10/23/2022  4:42 PM EDT ----- MRI of the lumbar spine shows some degenerative changes on the bottom 2 levels (L4-L5 and L5-S1).  There is no major nerve root compression though the 1 nerve root on the right might be a little affected.  This could cause some pain but would not affect walking.   If she is noting a fair amount of right leg pain we could have her do gabapentin 300 mg 3 times a day with several refills

## 2022-10-25 ENCOUNTER — Ambulatory Visit: Payer: Commercial Managed Care - PPO

## 2022-10-25 VITALS — BP 112/60 | HR 60 | Ht 66.0 in | Wt 155.6 lb

## 2022-10-25 DIAGNOSIS — I951 Orthostatic hypotension: Secondary | ICD-10-CM | POA: Diagnosis not present

## 2022-10-25 DIAGNOSIS — Z8679 Personal history of other diseases of the circulatory system: Secondary | ICD-10-CM

## 2022-10-25 NOTE — Patient Instructions (Signed)
Medication Instructions:  Your physician recommends that you continue on your current medications as directed. Please refer to the Current Medication list given to you today.  *If you need a refill on your cardiac medications before your next appointment, please call your pharmacy*   Lab Work: None Ordered If you have labs (blood work) drawn today and your tests are completely normal, you will receive your results only by: MyChart Message (if you have MyChart) OR A paper copy in the mail If you have any lab test that is abnormal or we need to change your treatment, we will call you to review the results.   Testing/Procedures: None Ordered   Follow-Up: At [facility], you and your health needs are our priority.  As part of our continuing mission to provide you with exceptional heart care, we have created designated Provider Care Teams.  These Care Teams include your primary Cardiologist (physician) and Advanced Practice Providers (APPs -  Physician Assistants and Nurse Practitioners) who all work together to provide you with the care you need, when you need it.  We recommend signing up for the patient portal called "MyChart".  Sign up information is provided on this After Visit Summary.  MyChart is used to connect with patients for Virtual Visits (Telemedicine).  Patients are able to view lab/test results, encounter notes, upcoming appointments, etc.  Non-urgent messages can be sent to your provider as well.   To learn more about what you can do with MyChart, go to [email or web address].    Your next appointment:   As needed  The format for your next appointment:   In Person  Provider:   Huntley Dec, MD    Other Instructions NA

## 2022-10-25 NOTE — Assessment & Plan Note (Signed)
Her description of syncope appears to be reflect syncope in the setting of orthostatic changes. No further recurrence. Symptoms do not seem suggestive of cardiac arrhythmias as underlying etiology. Overall reassured about benign nature.  Suggested hydration throughout the the day with increased fluid intake.  Can increase salt intake on a daily basis that can help prevent significant orthostatic changes.  If any significant change in her symptoms such as episodes of lightheadedness or near syncope/syncope occurring without changing position she can come for further evaluation.

## 2022-10-25 NOTE — Progress Notes (Signed)
Cardiology Consultation:    Date:  10/25/2022   ID:  Pamela Mcdowell, DOB November 25, 1975, MRN [id number]  PCP:  Philemon Kingdom, MD  Cardiologist:  Marlyn Corporal Jame Seelig, MD  Neurologist: Asa Lente, MD Neurosurgeon: pending visit with Dr. Lovell Sheehan at [facility].  Referring MD: Philemon Kingdom, MD   Syncope and orthostatic symptoms  History of Present Illness:    Pamela Mcdowell is a 47 y.o. female who is being seen today for the evaluation of recent syncope and orthostatic symptoms and further review of her echocardiogram findings at the request of Philemon Kingdom, MD.   H/o moderate spinal stenosis (c5,c6), with chronic neck pain and numbness of left hand, left breast cancer s/p double mastectomy in 2020 followed by breast implants in 2021, prior cholecystectomy in 2011, hysterectomy in December 2022, hypothyroidism, ADD, anxiety, depression, unintentional weight loss after hysterectomy resulting in menopause and currently using Ozempic for the past 10 months.  Pleasant woman, works as a Building services engineer at [employer].  Here for the visit by herself.  Mentions at baseline she has good functional status and used to exercise up to 5 times a week, now this is cut down to 2-3 times a week.  Typically exercising on her Peloton bike and also includes weight training.  For the last few months she has been dealing with symptoms of numbness and tingling down her left arm, transient lower extremity weakness.  She is currently undergoing evaluation with neurologist and is pending evaluation with neurosurgeon in view of moderate cervical spinal stenosis observed on recent imaging studies. Despite this she has been able to continue exercising and mentions this morning she was on a Peloton bike for 30 minutes and covered close to 8-1/2 miles.  No symptoms of chest pain, shortness of breath with her activity or exercise.  She does had symptoms where she notices a lightheadedness or  dizzy sensation on sudden change of position. Passed out while standing up after using restroom at night on June 14th.  She went to the ER at [facility].  Visit notes reviewed, blood pressure fluctuated between 106/71 mmHg to 117/72 mmHg.  Unremarkable EKG.  Transthoracic echocardiogram from September 20, 2022 at [facility] noted LVEF normal 55 to 60%, normal wall motion, normal diastolic function, normal RV function, trace MR, trace tricuspid regurgitation, essentially a normal study.  Labs August 17, 2022 hemoglobin 12.7, hematocrit 37.9, platelets 314 Sodium 140, potassium 4.1, BUN 17, creatinine 0.7  She was discharged home.  No further significant episodes similar to that.  She does at times continue to notice symptoms with change in position.  EKG in the clinic today shows sinus rhythm heart rate 60/min, normal parameters for PR interval, QRS duration, QTc.  She was concerned about the echocardiogram report talking about TAPSE numbers and TR velocities.  I reviewed the images myself and reassured her about the findings on the echocardiogram are consistent with the cardiac structure and function the most part, it trace leakiness with the valves not uncommon and overall appear to be without significant concerns.   Past Medical History:  Diagnosis Date   ADHD    Anxiety    Breast cancer (HCC) 12/2018   Cancer (HCC) 10/09/2022   Chronic left shoulder pain 03/05/2019   Depression    GERD (gastroesophageal reflux disease)    Hypothyroid    Numbness and tingling 03/05/2022    Past Surgical History:  Procedure Laterality Date   BREAST ENHANCEMENT SURGERY  CHOLECYSTECTOMY     COLONOSCOPY  07/21/2009   MASTECTOMY     TONSILLECTOMY     TOTAL VAGINAL HYSTERECTOMY  02/2021    Current Medications: Current Meds  Medication Sig   ALPRAZolam (XANAX) 0.25 MG tablet Take 0.25 mg by mouth 2 (two) times daily as needed for anxiety.   methylphenidate 36 MG PO CR tablet Take 36 mg by  mouth daily.   nitrofurantoin, macrocrystal-monohydrate, (MACROBID) 100 MG capsule Take 100 mg by mouth daily.   Semaglutide,0.25 or 0.5MG /DOS, 2 MG/1.5ML SOPN Inject into the skin.   venlafaxine XR (EFFEXOR-XR) 150 MG 24 hr capsule Take 150 mg by mouth daily with breakfast.     Allergies:   Patient has no known allergies.   Social History   Socioeconomic History   Marital status: Married    Spouse name: Not on file   Number of children: 1   Years of education: Not on file   Highest education level: Not on file  Occupational History   Occupation: Controller   Tobacco Use   Smoking status: Never   Smokeless tobacco: Never  Vaping Use   Vaping status: Never Used  Substance and Sexual Activity   Alcohol use: Yes    Alcohol/week: 7.0 standard drinks of alcohol    Types: 7 Glasses of wine per week   Drug use: Never   Sexual activity: Not Currently  Other Topics Concern   Not on file  Social History Narrative   Right Handed    Occasional Soda   Social Determinants of Health   Financial Resource Strain: Not on file  Food Insecurity: Not on file  Transportation Needs: Not on file  Physical Activity: Not on file  Stress: Not on file  Social Connections: Not on file     Family History: The patient's family history includes Alzheimer's disease in her paternal grandmother; Lymphoma in her maternal grandfather. There is no history of Colon cancer, Esophageal cancer, Stomach cancer, or Rectal cancer. ROS:   Please see the history of present illness.    All 14 point review of systems negative except as described per history of present illness.  EKGs/Labs/Other Studies Reviewed:    The following studies were reviewed today:   EKG:  EKG Interpretation Date/Time:  Thursday October 25 2022 08:49:56 EDT Ventricular Rate:  60 PR Interval:  142 QRS Duration:  88 QT Interval:  422 QTC Calculation: 422 R Axis:   82  Text Interpretation: Normal sinus rhythm Normal ECG No  previous ECGs available Confirmed by Huntley Dec reddy [phone]) on 10/25/2022 8:51:26 AM    Recent Labs: No results found for requested labs within last 365 days.  Recent Lipid Panel No results found for: "CHOL", "TRIG", "HDL", "CHOLHDL", "VLDL", "LDLCALC", "LDLDIRECT"  Physical Exam:    VS:  BP 112/60   Pulse 60   Ht 5\' 6"  (1.676 m)   Wt 155 lb 9.6 oz (70.6 kg)   SpO2 99%   BMI 25.11 kg/m     Wt Readings from Last 3 Encounters:  10/25/22 155 lb 9.6 oz (70.6 kg)  10/03/22 154 lb 8 oz (70.1 kg)  03/10/18 146 lb (66.2 kg)     GENERAL:  Well nourished, well developed in no acute distress NECK: No JVD CARDIAC: RRR, S1 and S2 present, no murmurs, no rubs, no gallops CHEST:  Clear to auscultation without rales, wheezing or rhonchi  Extremities: No pitting pedal edema. Pulses bilaterally symmetric with radial 2+  NEUROLOGIC:  Alert and oriented  x 3   ASSESSMENT AND PLAN:   47 year old pleasant woman with no prior significant cardiac history, good functional status with regular exercise until recently, with an episode of reflect syncope which appears secondary to positional changes and presumably due to orthostatic hypotension. No further recurrence. Problem List Items Addressed This Visit     Syncope due to orthostatic hypotension    Her description of syncope appears to be reflect syncope in the setting of orthostatic changes. No further recurrence. Symptoms do not seem suggestive of cardiac arrhythmias as underlying etiology. Overall reassured about benign nature.  Suggested hydration throughout the the day with increased fluid intake.  Can increase salt intake on a daily basis that can help prevent significant orthostatic changes.  If any significant change in her symptoms such as episodes of lightheadedness or near syncope/syncope occurring without changing position she can come for further evaluation.      Other Visit Diagnoses     H/O orthostatic hypotension     -  Primary   Relevant Orders   EKG 12-Lead (Completed)      Return to clinic on an as-needed basis.  Medication Adjustments/Labs and Tests Ordered: Current medicines are reviewed at length with the patient today.  Concerns regarding medicines are outlined above.  Orders Placed This Encounter  Procedures   EKG 12-Lead   No orders of the defined types were placed in this encounter.   Signed, Cecille Amsterdam, MD, MPH, [facility]. 10/25/2022 9:23 AM    [facility]

## 2023-06-04 HISTORY — PX: CERVICAL SPINE SURGERY: SHX589

## 2023-06-24 ENCOUNTER — Telehealth: Payer: Self-pay | Admitting: Internal Medicine

## 2023-06-24 NOTE — Telephone Encounter (Signed)
 Good morning Dr. Willy Harvest I received a call from this patient requesting to have her procedure for a colonoscopy moved up due to her having abdominal pain companied with having to sensation of making a BM every time she eats something and not actually being able to make that BM. Would you please advise on scheduling.

## 2023-08-07 ENCOUNTER — Ambulatory Visit: Admitting: Physician Assistant

## 2023-08-07 ENCOUNTER — Encounter: Payer: Self-pay | Admitting: Physician Assistant

## 2023-08-07 VITALS — BP 116/74 | HR 89 | Ht 66.0 in | Wt 166.2 lb

## 2023-08-07 DIAGNOSIS — R1310 Dysphagia, unspecified: Secondary | ICD-10-CM | POA: Diagnosis not present

## 2023-08-07 DIAGNOSIS — K5900 Constipation, unspecified: Secondary | ICD-10-CM

## 2023-08-07 DIAGNOSIS — Z1211 Encounter for screening for malignant neoplasm of colon: Secondary | ICD-10-CM

## 2023-08-07 MED ORDER — NA SULFATE-K SULFATE-MG SULF 17.5-3.13-1.6 GM/177ML PO SOLN: 1.0000 | Freq: Once | ORAL | 0 refills | Status: AC

## 2023-08-07 NOTE — Patient Instructions (Addendum)
 Start Miralax 1 capful daily in 8 ounces of liquid.   You have been scheduled for a colonoscopy. Please follow written instructions given to you at your visit today.   If you use inhalers (even only as needed), please bring them with you on the day of your procedure.  DO NOT TAKE 7 DAYS PRIOR TO TEST- Trulicity (dulaglutide) Ozempic, Wegovy (semaglutide) Mounjaro (tirzepatide) Bydureon Bcise (exanatide extended release)  DO NOT TAKE 1 DAY PRIOR TO YOUR TEST Rybelsus (semaglutide) Adlyxin (lixisenatide) Victoza (liraglutide) Byetta (exanatide) ___________________________________________________________________  _______________________________________________________  If your blood pressure at your visit was 140/90 or greater, please contact your primary care physician to follow up on this.  _______________________________________________________  If you are age 25 or older, your body mass index should be between 23-30. Your Body mass index is 26.83 kg/m. If this is out of the aforementioned range listed, please consider follow up with your Primary Care Provider.  If you are age 9 or younger, your body mass index should be between 19-25. Your Body mass index is 26.83 kg/m. If this is out of the aformentioned range listed, please consider follow up with your Primary Care Provider.   ________________________________________________________  The [facility] providers would like to encourage you to use MYCHART to communicate with providers for non-urgent requests or questions.  Due to long hold times on the telephone, sending your provider a message by [facility] may be a faster and more efficient way to get a response.  Please allow 48 business hours for a response.  Please remember that this is for non-urgent requests.  _______________________________________________________

## 2023-08-07 NOTE — Progress Notes (Signed)
 Chief Complaint: Discuss colonoscopy  HPI:    Pamela Mcdowell is a 48 year old female, known to Dr. Willy Harvest, with a past medical history as listed below including breast cancer, reflux and multiple others who was referred to me by Olan Bering, MD for a colonoscopy.    07/21/2009 colonoscopy normal.  Internal hemorrhoids.    03/10/2018 EGD for dysphagia and reflux with no endoscopic esophageal abnormality to explain dysphagia, gastritis.  Pathology showed mild reactive gastropathy.    06/23/23 patient seen as a video consult by [facility] gastro clinic for colonoscopy.  (Describes that this is because she wanted to get it done before June, but then they wanted to do it at the hospital and she did not want to proceed)    Today, patient presents to clinic and tells me that she is due for a colonoscopy since the guidelines changed.  She has had an increase in constipation over the past 3 months, reports some neck surgery and now going on a drastic diet change and thinks this has contributed.  She did start a fiber supplement, Citrucel about a month ago and has been taking it daily but has not noticed any change.  Describes that she will go once every 3 days and sometimes it is very little.    Also describes continued dysphagia, maybe 1 instance a month, this is very similar to what it was back in 2020 when she had her EGD which was normal.  She thinks maybe it is when she does not chew well enough.  She is not really worried by this.    Denies fever, chills, weight loss or blood in her stool.  Past Medical History:  Diagnosis Date   ADHD    Anxiety    Breast cancer (HCC) 12/2018   Cancer (HCC) 10/09/2022   Chronic left shoulder pain 03/05/2019   Depression    GERD (gastroesophageal reflux disease)    Hypothyroid    Numbness and tingling 03/05/2022    Past Surgical History:  Procedure Laterality Date   BREAST ENHANCEMENT SURGERY     CHOLECYSTECTOMY     COLONOSCOPY   07/21/2009   MASTECTOMY     TONSILLECTOMY     TOTAL VAGINAL HYSTERECTOMY  02/2021    Current Outpatient Medications  Medication Sig Dispense Refill   ALPRAZolam (XANAX) 0.25 MG tablet Take 0.25 mg by mouth 2 (two) times daily as needed for anxiety.     methylphenidate 36 MG PO CR tablet Take 36 mg by mouth daily.     nitrofurantoin, macrocrystal-monohydrate, (MACROBID) 100 MG capsule Take 100 mg by mouth daily.     Semaglutide,0.25 or 0.5MG /DOS, 2 MG/1.5ML SOPN Inject into the skin.     venlafaxine XR (EFFEXOR-XR) 150 MG 24 hr capsule Take 150 mg by mouth daily with breakfast.     No current facility-administered medications for this visit.    Allergies as of 08/07/2023   (No Known Allergies)    Family History  Problem Relation Age of Onset   Lymphoma Maternal Grandfather    Alzheimer's disease Paternal Grandmother    Colon cancer Neg Hx    Esophageal cancer Neg Hx    Stomach cancer Neg Hx    Rectal cancer Neg Hx     Social History   Socioeconomic History   Marital status: Married    Spouse name: Not on file   Number of children: 1   Years of education: Not on file   Highest education level: Not  on file  Occupational History   Occupation: Controller   Tobacco Use   Smoking status: Never   Smokeless tobacco: Never  Vaping Use   Vaping status: Never Used  Substance and Sexual Activity   Alcohol use: Yes    Alcohol/week: 7.0 standard drinks of alcohol    Types: 7 Glasses of wine per week   Drug use: Never   Sexual activity: Not Currently  Other Topics Concern   Not on file  Social History Narrative   Right Handed    Occasional Soda   Social Drivers of Health   Financial Resource Strain: Not on file  Food Insecurity: Not on file  Transportation Needs: Not on file  Physical Activity: Not on file  Stress: Not on file  Social Connections: Not on file  Intimate Partner Violence: Not on file    Review of Systems:    Constitutional: No weight loss, fever or  chills Skin: No rash Cardiovascular: No chest pain Respiratory: No SOB Gastrointestinal: See HPI and otherwise negative Genitourinary: No dysuria Neurological: No headache, dizziness or syncope Musculoskeletal: No new muscle or joint pain Hematologic: No bleeding  Psychiatric: No history of depression or anxiety   Physical Exam:  Vital signs: BP 116/74   Pulse 89   Ht 5\' 6"  (1.676 m)   Wt 166 lb 4 oz (75.4 kg)   BMI 26.83 kg/m    Constitutional:   Pleasant Caucasian female appears to be in NAD, Well developed, Well nourished, alert and cooperative Head:  Normocephalic and atraumatic. Eyes:   PEERL, EOMI. No icterus. Conjunctiva pink. Ears:  Normal auditory acuity. Neck:  Supple Throat: Oral cavity and pharynx without inflammation, swelling or lesion.  Respiratory: Respirations even and unlabored. Lungs clear to auscultation bilaterally.   No wheezes, crackles, or rhonchi.  Cardiovascular: Normal S1, S2. No MRG. Regular rate and rhythm. No peripheral edema, cyanosis or pallor.  Gastrointestinal:  Soft, nondistended, nontender. No rebound or guarding.  Decreased bowel sounds all 4 quadrants. No appreciable masses or hepatomegaly. Rectal:  Not performed.  Msk:  Symmetrical without gross deformities. Without edema, no deformity or joint abnormality.  Neurologic:  Alert and  oriented x4;  grossly normal neurologically.  Skin:   Dry and intact without significant lesions or rashes. Psychiatric: Demonstrates good judgement and reason without abnormal affect or behaviors.  RELEVANT LABS AND IMAGING: CBC    Component Value Date/Time   WBC 5.1 07/04/2009 1512   RBC 3.88 07/04/2009 1512   HGB 12.0 07/04/2009 1512   HCT 35.3 (L) 07/04/2009 1512   PLT 237.0 07/04/2009 1512   MCV 90.9 07/04/2009 1512   MCHC 34.1 07/04/2009 1512   RDW 14.6 07/04/2009 1512   LYMPHSABS 1.6 07/04/2009 1512   MONOABS 0.3 07/04/2009 1512   EOSABS 0.0 07/04/2009 1512   BASOSABS 0.0 07/04/2009 1512     CMP     Component Value Date/Time   NA 143 07/04/2009 1512   K 3.8 07/04/2009 1512   CL 105 07/04/2009 1512   CO2 33 (H) 07/04/2009 1512   GLUCOSE 90 07/04/2009 1512   BUN 12 07/04/2009 1512   CREATININE 0.7 07/04/2009 1512   CALCIUM 9.6 07/04/2009 1512   PROT 7.1 10/03/2022 1638   ALBUMIN 4.4 07/04/2009 1512   AST 30 07/04/2009 1512   ALT 18 07/04/2009 1512   ALKPHOS 45 07/04/2009 1512   BILITOT 0.5 07/04/2009 1512   GFRNONAA 103.30 07/04/2009 1512   Assessment: 1.  Constipation: Over the past 3 months  after a neck surgery and change in diet; likely situational/slow transit 2.  Dysphagia: Continues with some chronic issues, EGD in 2020 was normal, this only occurs about once a month, typically if she is not doing well; consider anatomy versus dysmotility versus other 3.  Screening for colorectal cancer: Last colonoscopy in 2011, with repeat recommended screening age which is now changed to 30  Plan: 1.  Scheduled patient for screening colonoscopy in the [facility] with Dr. Willy Harvest.  Did provide the patient a detailed list of risks for the procedure and she agrees to proceed. Patient is appropriate for endoscopic procedure(s) in the ambulatory ([facility]) setting.  2.  Patient will have a 2-day bowel prep given history of constipation. 3.  Recommend the patient start MiraLAX daily in addition to her fiber supplement. 4.  Discussed dysphagia briefly, this really does not bother her but she will let me know if it starts to.  At that point recommend a barium swallow with tablet first being his EGD was normal back in 2020. 5.  Patient to follow in clinic per recommendations after time of colonoscopy.  Reginal Capra, PA-C [facility] 08/07/2023, 9:40 AM  Cc: Olan Bering, MD

## 2023-09-05 ENCOUNTER — Encounter: Payer: Self-pay | Admitting: Internal Medicine

## 2023-09-12 ENCOUNTER — Encounter: Payer: Self-pay | Admitting: Internal Medicine

## 2023-09-12 NOTE — Progress Notes (Signed)
 [facility] History and Physical   Primary Care Physician:  Jefferey Fitch, MD   Reason for Procedure:  Colon cancer screening  Plan:    Colonoscopy     HPI: Pamela Mcdowell is a 48 y.o. female presenting for a screening colonoscopy exam.  She has had some constipation problems thought related to changes in diet after C-spine surgery.   Past Medical History:  Diagnosis Date   ADHD    Anxiety    Breast cancer (HCC) 12/2018   Cancer (HCC) 10/09/2022   Chronic left shoulder pain 03/05/2019   Depression    GERD (gastroesophageal reflux disease)    Hypothyroid    Numbness and tingling 03/05/2022    Past Surgical History:  Procedure Laterality Date   BREAST ENHANCEMENT SURGERY     CERVICAL SPINE SURGERY  06/2023   Discectomy and disc arthroplasty C5-6 Dr. Mavis   CHOLECYSTECTOMY     COLONOSCOPY  07/21/2009   MASTECTOMY     TONSILLECTOMY     TOTAL VAGINAL HYSTERECTOMY  02/2021    Prior to Admission medications   Medication Sig Start Date End Date Taking? Authorizing Provider  ALPRAZolam (XANAX) 0.25 MG tablet Take 0.25 mg by mouth 2 (two) times daily as needed for anxiety.    [provider]  traZODone (DESYREL) 50 MG tablet Take 50 mg by mouth at bedtime.    [provider]  venlafaxine XR (EFFEXOR-XR) 150 MG 24 hr capsule Take 150 mg by mouth daily with breakfast.    [provider]  VYVANSE 50 MG capsule Take 50 mg by mouth every morning.    [provider]    Current Outpatient Medications  Medication Sig Dispense Refill   traZODone (DESYREL) 50 MG tablet Take 50 mg by mouth at bedtime.     venlafaxine XR (EFFEXOR-XR) 150 MG 24 hr capsule Take 150 mg by mouth daily with breakfast.     VYVANSE 50 MG capsule Take 50 mg by mouth every morning.     ALPRAZolam (XANAX) 0.25 MG tablet Take 0.25 mg by mouth 2 (two) times daily as needed for anxiety.     Current Facility-Administered Medications  Medication Dose  Route Frequency Provider Last Rate Last Admin   0.9 %  sodium chloride  infusion  500 mL Intravenous Once Avram Lupita BRAVO, MD        Allergies as of 09/13/2023 - Review Complete 09/13/2023  Allergen Reaction Noted   Zolpidem Other (See Comments) 09/13/2023    Family History  Problem Relation Age of Onset   Lymphoma Maternal Grandfather    Alzheimer's disease Paternal Grandmother    Colon cancer Neg Hx    Esophageal cancer Neg Hx    Stomach cancer Neg Hx    Rectal cancer Neg Hx     Social History   Socioeconomic History   Marital status: Married    Spouse name: Not on file   Number of children: 1   Years of education: Not on file   Highest education level: Not on file  Occupational History   Occupation: Controller   Tobacco Use   Smoking status: Never   Smokeless tobacco: Never  Vaping Use   Vaping status: Never Used  Substance and Sexual Activity   Alcohol use: Yes    Alcohol/week: 7.0 standard drinks of alcohol    Types: 7 Glasses of wine per week   Drug use: Never   Sexual activity: Not Currently  Other Topics Concern   Not on file  Social History Narrative   Right Handed    Occasional Soda   Social Drivers of Health   Financial Resource Strain: Not on file  Food Insecurity: Not on file  Transportation Needs: Not on file  Physical Activity: Not on file  Stress: Not on file  Social Connections: Not on file  Intimate Partner Violence: Not on file    Review of Systems:  All other review of systems negative except as mentioned in the HPI.  Physical Exam: Vital signs BP 129/85   Pulse (!) 57   Temp 97.6 F (36.4 C) (Skin)   Ht 5' 6 (1.676 m)   Wt 166 lb (75.3 kg)   SpO2 100%   BMI 26.79 kg/m   General:   Alert,  Well-developed, well-nourished, pleasant and cooperative in NAD Lungs:  Clear throughout to auscultation.   Heart:  Regular rate and rhythm; no murmurs, clicks, rubs,  or gallops. Abdomen:  Soft, nontender and nondistended. Normal bowel  sounds.   Neuro/Psych:  Alert and cooperative. Normal mood and affect. A and O x 3   @Kemi Gell  Pamela Commander, MD, [facility] [phone] (pager) 09/13/2023 2:46 PM@

## 2023-09-13 ENCOUNTER — Ambulatory Visit (AMBULATORY_SURGERY_CENTER): Admitting: Internal Medicine

## 2023-09-13 ENCOUNTER — Encounter: Payer: Self-pay | Admitting: Internal Medicine

## 2023-09-13 VITALS — BP 97/62 | HR 55 | Temp 97.6°F | Resp 19 | Ht 66.0 in | Wt 166.0 lb

## 2023-09-13 DIAGNOSIS — Z1211 Encounter for screening for malignant neoplasm of colon: Secondary | ICD-10-CM

## 2023-09-13 MED ORDER — SODIUM CHLORIDE 0.9 % IV SOLN: 500.0000 mL | Freq: Once | INTRAVENOUS | Status: DC

## 2023-09-13 NOTE — Patient Instructions (Addendum)
 Normal colonoscopy today - no polyps or cancer were seen.  I suggest trying a tablespoon of psyllium husk every day for constipation - ramp up from 1 teaspoon to 3 teaspoons or a tablespoon daily.  If that is not successful then could try 1/2 dose of MiraLax daily or every other day.  I appreciate the opportunity to care for you. Lupita CHARLENA Commander, MD, FACG   YOU HAD AN ENDOSCOPIC PROCEDURE TODAY AT THE [facility]:   Refer to the procedure report that was given to you for any specific questions about what was found during the examination.  If the procedure report does not answer your questions, please call your gastroenterologist to clarify.  If you requested that your care partner not be given the details of your procedure findings, then the procedure report has been included in a sealed envelope for you to review at your convenience later.  YOU SHOULD EXPECT: Some feelings of bloating in the abdomen. Passage of more gas than usual.  Walking can help get rid of the air that was put into your GI tract during the procedure and reduce the bloating. If you had a lower endoscopy (such as a colonoscopy or flexible sigmoidoscopy) you may notice spotting of blood in your stool or on the toilet paper. If you underwent a bowel prep for your procedure, you may not have a normal bowel movement for a few days.  Please Note:  You might notice some irritation and congestion in your nose or some drainage.  This is from the oxygen used during your procedure.  There is no need for concern and it should clear up in a day or so.  SYMPTOMS TO REPORT IMMEDIATELY:  Following lower endoscopy (colonoscopy or flexible sigmoidoscopy):  Excessive amounts of blood in the stool  Significant tenderness or worsening of abdominal pains  Swelling of the abdomen that is new, acute  Fever of 100F or higher  For urgent or emergent issues, a gastroenterologist can be reached at any hour by calling [phone]  [phone]. Do not use MyChart messaging for urgent concerns.    DIET:  We do recommend a small meal at first, but then you may proceed to your regular diet.  Drink plenty of fluids but you should avoid alcoholic beverages for 24 hours.  ACTIVITY:  You should plan to take it easy for the rest of today and you should NOT DRIVE or use heavy machinery until tomorrow (because of the sedation medicines used during the test).    FOLLOW UP: Our staff will call the number listed on your records the next business day following your procedure.  We will call around 7:15- 8:00 am to check on you and address any questions or concerns that you may have regarding the information given to you following your procedure. If we do not reach you, we will leave a message.     If any biopsies were taken you will be contacted by phone or by letter within the next 1-3 weeks.  Please call us  at [phone] if you have not heard about the biopsies in 3 weeks.    SIGNATURES/CONFIDENTIALITY: You and/or your care partner have signed paperwork which will be entered into your electronic medical record.  These signatures attest to the fact that that the information above on your After Visit Summary has been reviewed and is understood.  Full responsibility of the confidentiality of this discharge information lies with you and/or your care-partner.

## 2023-09-13 NOTE — Progress Notes (Signed)
VS by DT    

## 2023-09-13 NOTE — Op Note (Signed)
 [facility] Patient Name: Pamela Mcdowell Procedure Date: 09/13/2023 2:35 PM MRN: [id number] Endoscopist: Lupita FORBES Commander , MD, [id number] Age: 48 Referring MD:  Date of Birth: June 18, 1975 Gender: Female Account #: [id number] Procedure:                Colonoscopy Indications:              Screening for colorectal malignant neoplasm Medicines:                Monitored Anesthesia Care Procedure:                Pre-Anesthesia Assessment:                           - Prior to the procedure, a History and Physical                            was performed, and patient medications and                            allergies were reviewed. The patient's tolerance of                            previous anesthesia was also reviewed. The risks                            and benefits of the procedure and the sedation                            options and risks were discussed with the patient.                            All questions were answered, and informed consent                            was obtained. Prior Anticoagulants: The patient has                            taken no anticoagulant or antiplatelet agents. ASA                            Grade Assessment: II - A patient with mild systemic                            disease. After reviewing the risks and benefits,                            the patient was deemed in satisfactory condition to                            undergo the procedure.                           After obtaining informed consent, the colonoscope  was passed under direct vision. Throughout the                            procedure, the patient's blood pressure, pulse, and                            oxygen saturations were monitored continuously. The                            Olympus Scope SN: [id number] was introduced through                            the anus and advanced to the the cecum, identified                            by  appendiceal orifice and ileocecal valve. The                            colonoscopy was performed without difficulty. The                            patient tolerated the procedure well. The quality                            of the bowel preparation was good. The ileocecal                            valve, appendiceal orifice, and rectum were                            photographed. The bowel preparation used was                            MiraLax+SUPREP via extended prep with split dose                            instruction. Scope In: 2:55:08 PM Scope Out: 3:07:58 PM Scope Withdrawal Time: 0 hours 9 minutes 0 seconds  Total Procedure Duration: 0 hours 12 minutes 50 seconds  Findings:                 The perianal and digital rectal examinations were                            normal.                           The entire examined colon appeared normal on direct                            and retroflexion views. Complications:            No immediate complications. Estimated Blood Loss:     Estimated blood loss: none. Impression:               - The entire examined colon is  normal on direct and                            retroflexion views.                           - No specimens collected. Recommendation:           - Patient has a contact number available for                            emergencies. The signs and symptoms of potential                            delayed complications were discussed with the                            patient. Return to normal activities tomorrow.                            Written discharge instructions were provided to the                            patient.                           - Resume previous diet.                           - Continue present medications.                           - Repeat colonoscopy in 10 years for screening                            purposes.                           - Psyllium 1 tablespoon daily vs 1/2 dose MiraLax                             daily for constipation Lupita FORBES Commander, MD 09/13/2023 3:17:19 PM This report has been signed electronically.

## 2023-09-13 NOTE — Progress Notes (Signed)
 Report to PACU, RN, vss, BBS= Clear.

## 2023-09-16 ENCOUNTER — Telehealth: Payer: Self-pay

## 2023-09-16 NOTE — Telephone Encounter (Signed)
  Follow up Call-     09/13/2023    2:33 PM  Call back number  Post procedure Call Back phone  # [phone]  Permission to leave phone message Yes     Patient questions:  Do you have a fever, pain , or abdominal swelling? No. Pain Score  0 *  Have you tolerated food without any problems? Yes.    Have you been able to return to your normal activities? Yes.    Do you have any questions about your discharge instructions: Diet   No. Medications  No. Follow up visit  No.  Do you have questions or concerns about your Care? No.  Actions: * If pain score is 4 or above: No action needed, pain <4.

## 2023-12-06 ENCOUNTER — Institutional Professional Consult (permissible substitution): Admitting: Plastic Surgery

## 2023-12-19 ENCOUNTER — Institutional Professional Consult (permissible substitution)

## 2023-12-20 ENCOUNTER — Institutional Professional Consult (permissible substitution): Admitting: Plastic Surgery
